# Patient Record
Sex: Female | Born: 1965 | Race: White | Hispanic: No | Smoking: Current every day smoker
Health system: Southern US, Community
[De-identification: ages and names within clinical notes are randomized; demographics above are authoritative.]

## PROBLEM LIST (undated history)

## (undated) DIAGNOSIS — F17200 Nicotine dependence, unspecified, uncomplicated: Secondary | ICD-10-CM

## (undated) DIAGNOSIS — Z8041 Family history of malignant neoplasm of ovary: Secondary | ICD-10-CM

## (undated) DIAGNOSIS — Z803 Family history of malignant neoplasm of breast: Secondary | ICD-10-CM

## (undated) DIAGNOSIS — Z566 Other physical and mental strain related to work: Secondary | ICD-10-CM

## (undated) HISTORY — DX: Family history of malignant neoplasm of ovary: Z80.41

## (undated) HISTORY — DX: Nicotine dependence, unspecified, uncomplicated: F17.200

## (undated) HISTORY — DX: Family history of malignant neoplasm of breast: Z80.3

## (undated) HISTORY — PX: WISDOM TOOTH EXTRACTION: SHX21

## (undated) HISTORY — DX: Other physical and mental strain related to work: Z56.6

---

## 1999-05-11 ENCOUNTER — Ambulatory Visit (HOSPITAL_BASED_OUTPATIENT_CLINIC_OR_DEPARTMENT_OTHER): Admission: RE | Admit: 1999-05-11 | Discharge: 1999-05-11 | Payer: Self-pay | Admitting: Orthopedic Surgery

## 1999-06-20 ENCOUNTER — Ambulatory Visit (HOSPITAL_BASED_OUTPATIENT_CLINIC_OR_DEPARTMENT_OTHER): Admission: RE | Admit: 1999-06-20 | Discharge: 1999-06-20 | Payer: Self-pay | Admitting: Orthopedic Surgery

## 2000-04-02 HISTORY — PX: HAND SURGERY: SHX662

## 2006-03-04 ENCOUNTER — Other Ambulatory Visit: Admission: RE | Admit: 2006-03-04 | Discharge: 2006-03-04 | Payer: Self-pay | Admitting: Gynecology

## 2006-03-20 ENCOUNTER — Encounter: Admission: RE | Admit: 2006-03-20 | Discharge: 2006-03-20 | Payer: Self-pay | Admitting: Gynecology

## 2006-04-01 ENCOUNTER — Encounter: Admission: RE | Admit: 2006-04-01 | Discharge: 2006-04-01 | Payer: Self-pay | Admitting: Gynecology

## 2007-03-06 ENCOUNTER — Other Ambulatory Visit: Admission: RE | Admit: 2007-03-06 | Discharge: 2007-03-06 | Payer: Self-pay | Admitting: Gynecology

## 2007-04-07 ENCOUNTER — Encounter: Admission: RE | Admit: 2007-04-07 | Discharge: 2007-04-07 | Payer: Self-pay | Admitting: Gynecology

## 2008-03-08 ENCOUNTER — Ambulatory Visit: Payer: Self-pay | Admitting: Women's Health

## 2008-03-08 ENCOUNTER — Encounter: Payer: Self-pay | Admitting: Women's Health

## 2008-03-08 ENCOUNTER — Other Ambulatory Visit: Admission: RE | Admit: 2008-03-08 | Discharge: 2008-03-08 | Payer: Self-pay | Admitting: Gynecology

## 2008-04-07 ENCOUNTER — Encounter: Admission: RE | Admit: 2008-04-07 | Discharge: 2008-04-07 | Payer: Self-pay | Admitting: Gynecology

## 2009-03-09 ENCOUNTER — Other Ambulatory Visit: Admission: RE | Admit: 2009-03-09 | Discharge: 2009-03-09 | Payer: Self-pay | Admitting: Gynecology

## 2009-03-09 ENCOUNTER — Ambulatory Visit: Payer: Self-pay | Admitting: Women's Health

## 2009-04-08 ENCOUNTER — Encounter: Admission: RE | Admit: 2009-04-08 | Discharge: 2009-04-08 | Payer: Self-pay | Admitting: Gynecology

## 2010-03-13 ENCOUNTER — Ambulatory Visit: Payer: Self-pay | Admitting: Women's Health

## 2010-03-13 ENCOUNTER — Other Ambulatory Visit
Admission: RE | Admit: 2010-03-13 | Discharge: 2010-03-13 | Payer: Self-pay | Source: Home / Self Care | Admitting: Gynecology

## 2010-04-10 ENCOUNTER — Encounter
Admission: RE | Admit: 2010-04-10 | Discharge: 2010-04-10 | Payer: Self-pay | Source: Home / Self Care | Attending: Gynecology | Admitting: Gynecology

## 2010-04-23 ENCOUNTER — Encounter: Payer: Self-pay | Admitting: Gynecology

## 2011-03-09 DIAGNOSIS — F17200 Nicotine dependence, unspecified, uncomplicated: Secondary | ICD-10-CM | POA: Insufficient documentation

## 2011-03-15 ENCOUNTER — Encounter: Payer: Self-pay | Admitting: Women's Health

## 2011-03-15 ENCOUNTER — Ambulatory Visit (INDEPENDENT_AMBULATORY_CARE_PROVIDER_SITE_OTHER): Payer: Managed Care, Other (non HMO) | Admitting: Women's Health

## 2011-03-15 ENCOUNTER — Other Ambulatory Visit (HOSPITAL_COMMUNITY)
Admission: RE | Admit: 2011-03-15 | Discharge: 2011-03-15 | Disposition: A | Discharge status: Home / Self Care | Payer: Managed Care, Other (non HMO) | Source: Ambulatory Visit | Attending: Women's Health | Admitting: Women's Health

## 2011-03-15 VITALS — BP 118/78 | Ht 62.25 in | Wt 202.0 lb

## 2011-03-15 DIAGNOSIS — Z13220 Encounter for screening for lipoid disorders: Secondary | ICD-10-CM

## 2011-03-15 DIAGNOSIS — F419 Anxiety disorder, unspecified: Secondary | ICD-10-CM

## 2011-03-15 DIAGNOSIS — Z833 Family history of diabetes mellitus: Secondary | ICD-10-CM

## 2011-03-15 DIAGNOSIS — Z01419 Encounter for gynecological examination (general) (routine) without abnormal findings: Secondary | ICD-10-CM

## 2011-03-15 DIAGNOSIS — R82998 Other abnormal findings in urine: Secondary | ICD-10-CM

## 2011-03-15 DIAGNOSIS — Z23 Encounter for immunization: Secondary | ICD-10-CM

## 2011-03-15 DIAGNOSIS — F411 Generalized anxiety disorder: Secondary | ICD-10-CM

## 2011-03-15 MED ORDER — ALPRAZOLAM 0.25 MG PO TABS: 0.2500 mg | ORAL_TABLET | Freq: Three times a day (TID) | ORAL | Status: AC | PRN

## 2011-03-15 NOTE — Progress Notes (Signed)
Kaydynce Pat Aug 30, 1965 [id number]    History:    The patient presents for annual exam.  Monthly 4 day cycle/vasectomy. Situational anxiety, requested refill on Xanax.   Past medical history, past surgical history, family history and social history were all reviewed and documented in the EPIC chart. Mother diagnosed with breast cancer at age 46, BRCA testing not done, living and doing well 46. Smoker half pack per day.   ROS:  A  ROS was performed and pertinent positives and negatives are included in the history.  Exam:  Filed Vitals:   03/15/11 0856  BP: 118/78    General appearance:  Normal Head/Neck:  Normal, without cervical or supraclavicular adenopathy. Thyroid:  Symmetrical, normal in size, without palpable masses or nodularity. Respiratory  Effort:  Normal  Auscultation:  Clear without wheezing or rhonchi Cardiovascular  Auscultation:  Regular rate, without rubs, murmurs or gallops  Edema/varicosities:  Not grossly evident Abdominal  Soft,nontender, without masses, guarding or rebound.  Liver/spleen:  No organomegaly noted  Hernia:  None appreciated  Skin  Inspection:  Grossly normal  Palpation:  Grossly normal Neurologic/psychiatric  Orientation:  Normal with appropriate conversation.  Mood/affect:  Normal  Genitourinary    Breasts: Examined lying and sitting.     Right: Without masses, retractions, discharge or axillary adenopathy.     Left: Without masses, retractions, discharge or axillary adenopathy.   Inguinal/mons:  Normal without inguinal adenopathy  External genitalia:  Normal  BUS/Urethra/Skene's glands:  Normal  Bladder:  Normal  Vagina:  Normal  Cervix:  Normal  Uterus:   normal in size, shape and contour.  Midline and mobile  Adnexa/parametria:     Rt: Without masses or tenderness.   Lt: Without masses or tenderness.  Anus and perineum: Normal  Digital rectal exam: Normal sphincter tone without palpated masses or tenderness  Assessment/Plan:   45 y.o. DW F G2 P2 for annual exam.    Normal GYN exam Smoker half pack per day Situational anxiety  Plan: SBEs, continue annual mammogram, calcium rich diet, vitamin D 2000 daily encouraged. Aware of the hazards of smoking and plans to quit. Denies need for Chantix, discussed smoking cessation. Xanax 0.25 every 8 hours when necessary #30 no refills. Prescription proper use was given aware it is addictive and use sparingly. CBC, glucose, lipid profile, UA and Pap. Flu vaccine given.    Harrington Challenger [facility], 9:46 AM 03/15/2011

## 2011-03-16 LAB — GLUCOSE, RANDOM: Glucose, Bld: 91 mg/dL (ref 70–99)

## 2011-03-23 ENCOUNTER — Other Ambulatory Visit: Payer: Self-pay | Admitting: Gynecology

## 2011-03-23 DIAGNOSIS — Z1231 Encounter for screening mammogram for malignant neoplasm of breast: Secondary | ICD-10-CM

## 2011-04-13 ENCOUNTER — Ambulatory Visit
Admission: RE | Admit: 2011-04-13 | Discharge: 2011-04-13 | Disposition: A | Discharge status: Home / Self Care | Payer: Managed Care, Other (non HMO) | Source: Ambulatory Visit | Attending: Gynecology | Admitting: Gynecology

## 2011-04-13 DIAGNOSIS — Z1231 Encounter for screening mammogram for malignant neoplasm of breast: Secondary | ICD-10-CM

## 2011-04-19 ENCOUNTER — Other Ambulatory Visit: Payer: Self-pay | Admitting: Gynecology

## 2011-04-24 ENCOUNTER — Other Ambulatory Visit: Payer: Self-pay | Admitting: *Deleted

## 2011-04-24 DIAGNOSIS — N63 Unspecified lump in unspecified breast: Secondary | ICD-10-CM

## 2011-05-02 ENCOUNTER — Ambulatory Visit
Admission: RE | Admit: 2011-05-02 | Discharge: 2011-05-02 | Disposition: A | Discharge status: Home / Self Care | Payer: Managed Care, Other (non HMO) | Source: Ambulatory Visit | Attending: Gynecology | Admitting: Gynecology

## 2011-05-02 DIAGNOSIS — N63 Unspecified lump in unspecified breast: Secondary | ICD-10-CM

## 2012-04-07 ENCOUNTER — Ambulatory Visit (INDEPENDENT_AMBULATORY_CARE_PROVIDER_SITE_OTHER): Payer: Managed Care, Other (non HMO) | Admitting: Women's Health

## 2012-04-07 ENCOUNTER — Encounter: Payer: Self-pay | Admitting: Women's Health

## 2012-04-07 VITALS — BP 130/80 | Ht 62.0 in | Wt 200.0 lb

## 2012-04-07 DIAGNOSIS — F419 Anxiety disorder, unspecified: Secondary | ICD-10-CM

## 2012-04-07 DIAGNOSIS — F411 Generalized anxiety disorder: Secondary | ICD-10-CM

## 2012-04-07 DIAGNOSIS — Z13220 Encounter for screening for lipoid disorders: Secondary | ICD-10-CM

## 2012-04-07 DIAGNOSIS — Z01419 Encounter for gynecological examination (general) (routine) without abnormal findings: Secondary | ICD-10-CM

## 2012-04-07 DIAGNOSIS — E079 Disorder of thyroid, unspecified: Secondary | ICD-10-CM

## 2012-04-07 DIAGNOSIS — Z833 Family history of diabetes mellitus: Secondary | ICD-10-CM

## 2012-04-07 LAB — LIPID PANEL
Cholesterol: 215 mg/dL — ABNORMAL HIGH (ref 0–200)
HDL: 47 mg/dL (ref >39)
LDL Cholesterol: 137 mg/dL — ABNORMAL HIGH (ref 0–99)
Total CHOL/HDL Ratio: 4.6 Ratio

## 2012-04-07 LAB — CBC WITH DIFFERENTIAL/PLATELET
Basophils Relative: 0 % (ref 0–1)
Eosinophils Absolute: 0 10*3/uL (ref 0.0–0.7)
Hemoglobin: 14.6 g/dL (ref 12.0–15.0)
MCH: 32.3 pg (ref 26.0–34.0)
Neutro Abs: 6.4 10*3/uL (ref 1.7–7.7)
Platelets: 212 10*3/uL (ref 150–400)
RBC: 4.52 MIL/uL (ref 3.87–5.11)
WBC: 8.4 10*3/uL (ref 4.0–10.5)

## 2012-04-07 LAB — GLUCOSE, RANDOM: Glucose, Bld: 82 mg/dL (ref 70–99)

## 2012-04-07 MED ORDER — ALPRAZOLAM 0.25 MG PO TABS: 0.2500 mg | ORAL_TABLET | Freq: Every evening | ORAL | Status: DC | PRN

## 2012-04-07 NOTE — Patient Instructions (Addendum)

## 2012-04-07 NOTE — Progress Notes (Signed)
Susan Morse 04-24-1965 [id number]    History:    The patient presents for annual exam.  Regular monthly 4-5 day cycles/vasectomy. Smokes less than a half a pack per day. Mother with breast cancer at age 47, BRCA not done, living, 58. Sister with breast cancer with a negative BRCA this year. History of a cyst on mammogram January 2013. Noticed a lump left inner breast 2 weeks ago, has gotten smaller.   Past medical history, past surgical history, family history and social history were all reviewed and documented in the EPIC chart. Works at [employer]. Has 4 grandchildren, helping son who is studying Patent examiner.   ROS:  A  ROS was performed and pertinent positives and negatives are included in the history.  Exam:  Filed Vitals:   04/07/12 0923  BP: 130/80    General appearance:  Normal Head/Neck:  Normal, without cervical or supraclavicular adenopathy. Thyroid:  Symmetrical, normal in size, without palpable masses or nodularity. Respiratory  Effort:  Normal  Auscultation:  Clear without wheezing or rhonchi Cardiovascular  Auscultation:  Regular rate, without rubs, murmurs or gallops  Edema/varicosities:  Not grossly evident Abdominal  Soft,nontender, without masses, guarding or rebound.  Liver/spleen:  No organomegaly noted  Hernia:  None appreciated  Skin  Inspection:  Grossly normal  Palpation:  Grossly normal Neurologic/psychiatric  Orientation:  Normal with appropriate conversation.  Mood/affect:  Normal  Genitourinary    Breasts: Examined lying and sitting.     Right: Without masses, retractions, discharge or axillary adenopathy.     Left: Probable superficial 2 cm sebaceous cyst inner aspect at sternum    Inguinal/mons:  Normal without inguinal adenopathy  External genitalia:  Normal  BUS/Urethra/Skene's glands:  Normal  Bladder:  Normal  Vagina:  Normal  Cervix:  Normal  Uterus:  normal in size, shape and contour.  Midline and mobile  Adnexa/parametria:      Rt: Without masses or tenderness.   Lt: Without masses or tenderness.  Anus and perineum: Normal  Digital rectal exam: Normal sphincter tone without palpated masses or tenderness  Assessment/Plan:  47 y.o. WF G2 P2 for annual exam with complaint of  lump left breast inner aspect for 2 weeks.     Left breast nodule/probable sebaceous cyst/Sister and mother with breast cancer Smoker-less than half pack per day Obesity  Plan: Diagnostic left breast mammogram and annual mammogram which is due. SBE's, continue annual mammogram, increase regular exercise, decrease calories for weight loss, calcium rich diet, vitamin D 1000 daily encouraged. Continue to cut back on smoking, using electronic cigarette also. CBC, glucose, lipid panel, TSH, UA, Pap normal 2012, new screening guidelines reviewed.    Harrington Challenger [facility], 10:00 AM 04/07/2012

## 2012-04-08 ENCOUNTER — Other Ambulatory Visit: Payer: Self-pay | Admitting: Women's Health

## 2012-04-08 ENCOUNTER — Telehealth: Payer: Self-pay | Admitting: *Deleted

## 2012-04-08 DIAGNOSIS — N6009 Solitary cyst of unspecified breast: Secondary | ICD-10-CM

## 2012-04-08 LAB — URINALYSIS W MICROSCOPIC + REFLEX CULTURE
Hgb urine dipstick: NEGATIVE
Leukocytes, UA: NEGATIVE
Nitrite: NEGATIVE
Protein, ur: NEGATIVE mg/dL

## 2012-04-08 LAB — TSH: TSH: 3.154 u[IU]/mL (ref 0.350–4.500)

## 2012-04-08 NOTE — Telephone Encounter (Signed)
Message copied by Aura Camps on Tue Apr 08, 2012  8:41 AM ------      Message from: [place], [place] J      Created: Mon Apr 07, 2012 10:09 AM       Please schedule diagnostic mammogram at breast center. 2 cm probable sebaceous cyst on left inner breast at sternum. Mother and sister with breast cancer history. Patient can go at any time. Last mammogram January 2013, had a right breast cyst.

## 2012-04-11 NOTE — Telephone Encounter (Signed)
appt. 04/14/12 @ 2:40pm.

## 2012-04-14 ENCOUNTER — Ambulatory Visit
Admission: RE | Admit: 2012-04-14 | Discharge: 2012-04-14 | Disposition: A | Discharge status: Home / Self Care | Payer: Managed Care, Other (non HMO) | Source: Ambulatory Visit | Attending: Women's Health | Admitting: Women's Health

## 2012-04-14 DIAGNOSIS — N6009 Solitary cyst of unspecified breast: Secondary | ICD-10-CM

## 2012-08-20 ENCOUNTER — Ambulatory Visit (INDEPENDENT_AMBULATORY_CARE_PROVIDER_SITE_OTHER): Payer: Managed Care, Other (non HMO) | Admitting: Physician Assistant

## 2012-08-20 ENCOUNTER — Ambulatory Visit: Payer: Self-pay | Admitting: Women's Health

## 2012-08-20 VITALS — BP 110/70 | HR 82 | Temp 99.2°F | Resp 18 | Ht 63.0 in | Wt 205.0 lb

## 2012-08-20 DIAGNOSIS — M25519 Pain in unspecified shoulder: Secondary | ICD-10-CM

## 2012-08-20 DIAGNOSIS — M25512 Pain in left shoulder: Secondary | ICD-10-CM

## 2012-08-20 MED ORDER — MELOXICAM 15 MG PO TABS: 15.0000 mg | ORAL_TABLET | Freq: Every day | ORAL | Status: DC

## 2012-08-20 NOTE — Progress Notes (Signed)
  Subjective:    Patient ID: Susan Morse, female    DOB: 1965/12/19, 47 y.o.   MRN: [id number]  HPI    Susan Morse is a very pleasant 47 yr old female here with concern for left shoulder pain.  States she was chopping wood with her brother 5 days ago and moving the logs across the lawn.  The next day her shoulder felt a little stiff, but then yesterday after playing with her grandkids she began having pain.  Does not recall specific injury that incited pain, more insidious onset.  States it "hurts inside", doesn't really hurt to the touch.  Certain movements make the pain worse, though she can't articulate which movements are the most painful.  Did have some trouble sleeping last night but not sure if this is from the shoulder or from sinus trouble.  She has injured this shoulder in the distant past but has no ongoing issues.  Right shoulder feels a little stiff, but not painful like the left.  Took 2 Aleve at 10am today, no ice or heat.     Review of Systems  Constitutional: Negative for fever and chills.  HENT: Negative.   Respiratory: Negative.   Cardiovascular: Negative.   Gastrointestinal: Negative.   Musculoskeletal: Positive for myalgias and arthralgias.  Skin: Negative.   Neurological: Negative.        Objective:   Physical Exam  Vitals reviewed. Constitutional: She is oriented to person, place, and time. She appears well-developed and well-nourished. No distress.  HENT:  Head: Normocephalic and atraumatic.  Eyes: Conjunctivae are normal. No scleral icterus.  Pulmonary/Chest: Effort normal.  Musculoskeletal:       Right shoulder: Normal.       Left shoulder: She exhibits tenderness and pain. She exhibits normal range of motion, no bony tenderness, no swelling, no effusion, no crepitus, no deformity, no spasm and normal strength.       Arms: TTP over supraspinatus tendon; mild TTP over biceps tendon; full AROM; 5/5 strength; sensation intact; negative Neer's; negative drop arm    Neurological: She is alert and oriented to person, place, and time.  Skin: Skin is warm and dry.  Psychiatric: She has a normal mood and affect. Her behavior is normal.          Assessment & Plan:  Pain in joint, shoulder region, left - Plan: meloxicam (MOBIC) 15 MG tablet   Susan Morse is a pleasant 47 yr old female here with left shoulder pain.  Suspect overuse injury after chopping wood 5 days ago, exacerbated by playing with grandchildren last night.  Will start Mobic q24h tonight (as pt took Aleve approx 3 hours ago).  Encouraged icing 3-4 times per day.  Relative rest, but I do not want her to keep the shoulder completely immobilized.  Tylenol if needed for breakthrough pain.  Expect gradual improvement over the next week.  If worsening or no improvement in 7 days, pt to call, may need further eval.  Would consider PT or ortho eval if ongoing symptoms.

## 2012-08-20 NOTE — Patient Instructions (Addendum)
Begin taking the Mobic (meloxicam) tonight around 10pm - take one every 24 hours to help with pain/inflammation.  Continue this for at least 7 days.  Begin icing the shoulder today - 3-4 times per day for 15-20 minutes at a time.  Do not take any additional ibuprofen or aleve while taking the mobic - if you need further pain relief, you may take Tylenol (up to 1000mg  every 8 hours).  Do not keep the shoulder completely immobilized, gently moving the arm will be beneficial.  If you are worsening or not seeing any improvement in one week, please let us know.  We can consider physical therapy or a referral ortho.   Shoulder Pain The shoulder is the joint that connects your arms to your body. The bones that form the shoulder joint include the upper arm bone (humerus), the shoulder blade (scapula), and the collarbone (clavicle). The top of the humerus is shaped like a ball and fits into a rather flat socket on the scapula (glenoid cavity). A combination of muscles and strong, fibrous tissues that connect muscles to bones (tendons) support your shoulder joint and hold the ball in the socket. Small, fluid-filled sacs (bursae) are located in different areas of the joint. They act as cushions between the bones and the overlying soft tissues and help reduce friction between the gliding tendons and the bone as you move your arm. Your shoulder joint allows a wide range of motion in your arm. This range of motion allows you to do things like scratch your back or throw a ball. However, this range of motion also makes your shoulder more prone to pain from overuse and injury. Causes of shoulder pain can originate from both injury and overuse and usually can be grouped in the following four categories:  Redness, swelling, and pain (inflammation) of the tendon (tendinitis) or the bursae (bursitis).  Instability, such as a dislocation of the joint.  Inflammation of the joint (arthritis).  Broken bone (fracture). HOME  CARE INSTRUCTIONS   Apply ice to the sore area.  Put ice in a plastic bag.  Place a towel between your skin and the bag.  Leave the ice on for 15-20 minutes, 3-4 times per day for the first 2 days.  Stop using cold packs if they do not help with the pain.  Squeeze a soft ball or foam pad as much as possible to help prevent swelling.  Only take over-the-counter or prescription medicines for pain, discomfort, or fever as directed by your caregiver. SEEK MEDICAL CARE IF:   Your shoulder pain increases, or new pain develops in your arm, hand, or fingers.  Your hand or fingers become cold and numb.  Your pain is not relieved with medicines. SEEK IMMEDIATE MEDICAL CARE IF:   Your arm, hand, or fingers are numb or tingling.  Your arm, hand, or fingers are significantly swollen or turn white or blue. MAKE SURE YOU:   Understand these instructions.  Will watch your condition.  Will get help right away if you are not doing well or get worse. Document Released: 12/27/2004 Document Revised: 12/12/2011 Document Reviewed: 03/03/2011 [facility] Patient Information 2014 [place], [place].

## 2013-04-06 ENCOUNTER — Other Ambulatory Visit: Payer: Self-pay

## 2013-04-06 DIAGNOSIS — Z1231 Encounter for screening mammogram for malignant neoplasm of breast: Secondary | ICD-10-CM

## 2013-04-09 ENCOUNTER — Ambulatory Visit (INDEPENDENT_AMBULATORY_CARE_PROVIDER_SITE_OTHER): Payer: BC Managed Care – PPO | Admitting: Women's Health

## 2013-04-09 ENCOUNTER — Encounter: Payer: Managed Care, Other (non HMO) | Admitting: Women's Health

## 2013-04-09 ENCOUNTER — Other Ambulatory Visit (HOSPITAL_COMMUNITY)
Admission: RE | Admit: 2013-04-09 | Discharge: 2013-04-09 | Disposition: A | Discharge status: Home / Self Care | Payer: BC Managed Care – PPO | Source: Ambulatory Visit | Attending: Gynecology | Admitting: Gynecology

## 2013-04-09 ENCOUNTER — Encounter: Payer: Self-pay | Admitting: Women's Health

## 2013-04-09 VITALS — BP 124/74 | Ht 62.25 in | Wt 206.8 lb

## 2013-04-09 DIAGNOSIS — Z23 Encounter for immunization: Secondary | ICD-10-CM

## 2013-04-09 DIAGNOSIS — Z13220 Encounter for screening for lipoid disorders: Secondary | ICD-10-CM

## 2013-04-09 DIAGNOSIS — E079 Disorder of thyroid, unspecified: Secondary | ICD-10-CM

## 2013-04-09 DIAGNOSIS — Z833 Family history of diabetes mellitus: Secondary | ICD-10-CM

## 2013-04-09 DIAGNOSIS — F411 Generalized anxiety disorder: Secondary | ICD-10-CM

## 2013-04-09 DIAGNOSIS — F419 Anxiety disorder, unspecified: Secondary | ICD-10-CM

## 2013-04-09 DIAGNOSIS — Z01419 Encounter for gynecological examination (general) (routine) without abnormal findings: Secondary | ICD-10-CM | POA: Insufficient documentation

## 2013-04-09 LAB — CBC WITH DIFFERENTIAL/PLATELET
Basophils Absolute: 0 10*3/uL (ref 0.0–0.1)
Basophils Relative: 0 % (ref 0–1)
EOS ABS: 0.1 10*3/uL (ref 0.0–0.7)
Eosinophils Relative: 1 % (ref 0–5)
HEMATOCRIT: 43.9 % (ref 36.0–46.0)
HEMOGLOBIN: 15.2 g/dL — AB (ref 12.0–15.0)
LYMPHS ABS: 1.8 10*3/uL (ref 0.7–4.0)
Lymphocytes Relative: 22 % (ref 12–46)
MCH: 33.9 pg (ref 26.0–34.0)
MCHC: 34.6 g/dL (ref 30.0–36.0)
MCV: 98 fL (ref 78.0–100.0)
MONOS PCT: 7 % (ref 3–12)
Monocytes Absolute: 0.5 10*3/uL (ref 0.1–1.0)
NEUTROS ABS: 5.6 10*3/uL (ref 1.7–7.7)
NEUTROS PCT: 70 % (ref 43–77)
Platelets: 203 10*3/uL (ref 150–400)
RBC: 4.48 MIL/uL (ref 3.87–5.11)
RDW: 13.5 % (ref 11.5–15.5)
WBC: 8 10*3/uL (ref 4.0–10.5)

## 2013-04-09 LAB — LIPID PANEL
CHOL/HDL RATIO: 3.9 ratio
CHOLESTEROL: 204 mg/dL — AB (ref 0–200)
HDL: 52 mg/dL (ref >39)
LDL Cholesterol: 131 mg/dL — ABNORMAL HIGH (ref 0–99)
Triglycerides: 107 mg/dL (ref <150)
VLDL: 21 mg/dL (ref 0–40)

## 2013-04-09 LAB — GLUCOSE, RANDOM: GLUCOSE: 83 mg/dL (ref 70–99)

## 2013-04-09 MED ORDER — ALPRAZOLAM 0.25 MG PO TABS: 0.2500 mg | ORAL_TABLET | Freq: Every evening | ORAL | Status: DC | PRN

## 2013-04-09 NOTE — Progress Notes (Signed)
Susan Morse [birth year] [id number]    History:    The patient presents for annual exam.  Monthly cycle/ Same partner/ Vasectomy. Normal Pap and mammogram history. Under increased stress at work, requesting refill on Xanax. Smoker half pack per day   Past medical history, past surgical history, family history and social history were all reviewed and documented in the EPIC chart. Mother breast cancer age 48, sister breast cancer BRCA status unknown. 2 children 4 grandchildren all well.    ROS:  A  ROS was performed and pertinent positives and negatives are included in the history.  Exam:  Filed Vitals:   04/09/13 0955  BP: 124/74    General appearance:  Normal, obese Head/Neck:  Normal, without cervical or supraclavicular adenopathy. Thyroid:  Symmetrical, normal in size, without palpable masses or nodularity. Respiratory  Effort:  Normal  Auscultation:  Clear without wheezing or rhonchi Cardiovascular  Auscultation:  Regular rate, without rubs, murmurs or gallops  Edema/varicosities:  Not grossly evident Abdominal  Soft,nontender, without masses, guarding or rebound.  Liver/spleen:  No organomegaly noted  Hernia:  None appreciated  Skin  Inspection:  Grossly normal  Palpation:  Grossly normal Neurologic/psychiatric  Orientation:  Normal with appropriate conversation.  Mood/affect:  Normal  Genitourinary    Breasts: Pendulous. Examined lying and sitting.     Right: Without masses, retractions, discharge or axillary adenopathy.     Left: Without masses, retractions, discharge or axillary adenopathy.   Inguinal/mons:  Normal without inguinal adenopathy  External genitalia:  Normal  BUS/Urethra/Skene's glands:  Normal  Bladder:  Normal  Vagina:  Normal  Cervix:  Normal  Uterus:  Normal in size, shape and contour.  Midline and mobile  Adnexa/parametria:     Rt: Without masses or tenderness.   Lt: Without masses or tenderness.  Anus and perineum: Normal  Digital rectal  exam: Normal sphincter tone without palpated masses or tenderness  Assessment:  48 y.o. SWF G2P2  for annual exam.   Normal GYN exam/vasectomy Obesity Anxiety  Plan: SBE's. Continue annual mammogram, 3D tomography reviewed and encouraged, family history of breast cancer.   Discussed exercise, fitness programs and calorie control for weight loss.SBE's. Continue annual mammogram.  Encouraged smoking cessation and weight loss. CBC, glucose lipid profile, UA. Tdap. Pap. Pap normal 03/2011, new screening guidelines reviewed.  Xanax 0.25 every 8 hours when necessary aware of addictive properties and does use sparingly.     Huel Cote [facility], 10:37 AM 04/09/2013

## 2013-04-09 NOTE — Patient Instructions (Signed)

## 2013-04-09 NOTE — Addendum Note (Signed)
Addended by: Alen Blew on: 04/09/2013 12:18 PM   Modules accepted: Orders

## 2013-04-10 LAB — URINALYSIS W MICROSCOPIC + REFLEX CULTURE
BACTERIA UA: NONE SEEN
BILIRUBIN URINE: NEGATIVE
Casts: NONE SEEN
Crystals: NONE SEEN
GLUCOSE, UA: NEGATIVE mg/dL
HGB URINE DIPSTICK: NEGATIVE
Ketones, ur: NEGATIVE mg/dL
Leukocytes, UA: NEGATIVE
Nitrite: NEGATIVE
PROTEIN: NEGATIVE mg/dL
Specific Gravity, Urine: 1.007 (ref 1.005–1.030)
Squamous Epithelial / LPF: NONE SEEN
Urobilinogen, UA: 0.2 mg/dL (ref 0.0–1.0)
pH: 6.5 (ref 5.0–8.0)

## 2013-04-10 LAB — TSH: TSH: 3.048 u[IU]/mL (ref 0.350–4.500)

## 2013-04-10 NOTE — Addendum Note (Signed)
Addended by: Alen Blew on: 04/10/2013 10:25 AM   Modules accepted: Orders

## 2013-04-24 ENCOUNTER — Ambulatory Visit
Admission: RE | Admit: 2013-04-24 | Discharge: 2013-04-24 | Disposition: A | Discharge status: Home / Self Care | Payer: BC Managed Care – PPO | Source: Ambulatory Visit

## 2013-04-24 DIAGNOSIS — Z1231 Encounter for screening mammogram for malignant neoplasm of breast: Secondary | ICD-10-CM

## 2013-05-01 ENCOUNTER — Other Ambulatory Visit: Payer: Self-pay | Admitting: Women's Health

## 2013-05-01 DIAGNOSIS — R928 Other abnormal and inconclusive findings on diagnostic imaging of breast: Secondary | ICD-10-CM

## 2013-05-12 ENCOUNTER — Ambulatory Visit
Admission: RE | Admit: 2013-05-12 | Discharge: 2013-05-12 | Disposition: A | Discharge status: Home / Self Care | Payer: Self-pay | Source: Ambulatory Visit | Attending: Women's Health | Admitting: Women's Health

## 2013-05-12 DIAGNOSIS — R928 Other abnormal and inconclusive findings on diagnostic imaging of breast: Secondary | ICD-10-CM

## 2013-05-14 ENCOUNTER — Telehealth: Payer: Self-pay | Admitting: *Deleted

## 2013-05-14 DIAGNOSIS — Z803 Family history of malignant neoplasm of breast: Secondary | ICD-10-CM

## 2013-05-14 NOTE — Telephone Encounter (Signed)
Order placed for MRI [facility] imaging will contact pt to schedule.

## 2013-05-14 NOTE — Telephone Encounter (Signed)
Message copied by Thamas Jaegers on Thu May 14, 2013  2:58 PM ------      Message from: [place], [place] J      Created: Tue May 12, 2013  9:30 AM       Telephone call, reviewed mammogram recommendations, repeat diagnostic left- mammogram in 6 months and breast MRI was recommended for now. We'll get scheduled. Reviewed if any changes in 6 months diagnostic mammogram would recommend biopsy at that time. States feels comfortable with plan. Mother with breast cancer age 19 and sister at age 43.            Anderson Malta  Please schedule breast MRI for any day other than Thursday and call at [phone].  thanks ------

## 2013-05-20 ENCOUNTER — Telehealth: Payer: Self-pay

## 2013-05-20 NOTE — Telephone Encounter (Signed)
I contacted [employer] for prior auth for bilateral MRI of breasts due to  breast density and strong family history.  The clinical nurse reviewer requested lifetime risk. She had me google for a lifetime risk calculator and I called the patient and calculated her lifetime risk at 39.5%. I called back and spoke with nurse, Angela Nevin who authorized the MRI auth #[id number] valid x 30 days from today.  Info was added to her appt notes.

## 2013-05-20 NOTE — Telephone Encounter (Signed)
Appointment 05/22/13 at [facility]

## 2013-05-22 ENCOUNTER — Ambulatory Visit
Admission: RE | Admit: 2013-05-22 | Discharge: 2013-05-22 | Disposition: A | Discharge status: Home / Self Care | Payer: BC Managed Care – PPO | Source: Ambulatory Visit | Attending: Women's Health | Admitting: Women's Health

## 2013-05-22 DIAGNOSIS — Z803 Family history of malignant neoplasm of breast: Secondary | ICD-10-CM

## 2013-05-22 MED ORDER — GADOBENATE DIMEGLUMINE 529 MG/ML IV SOLN
20.0000 mL | Freq: Once | INTRAVENOUS | Status: AC | PRN
  Administered 2013-05-22: 20 mL via INTRAVENOUS

## 2013-11-09 ENCOUNTER — Other Ambulatory Visit: Payer: Self-pay | Admitting: Women's Health

## 2013-11-09 DIAGNOSIS — R921 Mammographic calcification found on diagnostic imaging of breast: Secondary | ICD-10-CM

## 2013-11-09 DIAGNOSIS — N6320 Unspecified lump in the left breast, unspecified quadrant: Secondary | ICD-10-CM

## 2013-11-11 ENCOUNTER — Other Ambulatory Visit: Payer: Self-pay | Admitting: Women's Health

## 2013-11-11 ENCOUNTER — Other Ambulatory Visit: Payer: Self-pay

## 2013-11-11 DIAGNOSIS — R921 Mammographic calcification found on diagnostic imaging of breast: Secondary | ICD-10-CM

## 2013-11-11 DIAGNOSIS — N6320 Unspecified lump in the left breast, unspecified quadrant: Secondary | ICD-10-CM

## 2013-11-17 ENCOUNTER — Ambulatory Visit
Admission: RE | Admit: 2013-11-17 | Discharge: 2013-11-17 | Disposition: A | Discharge status: Home / Self Care | Payer: BC Managed Care – PPO | Source: Ambulatory Visit | Attending: Women's Health | Admitting: Women's Health

## 2013-11-17 ENCOUNTER — Encounter (INDEPENDENT_AMBULATORY_CARE_PROVIDER_SITE_OTHER): Payer: Self-pay

## 2013-11-17 DIAGNOSIS — R921 Mammographic calcification found on diagnostic imaging of breast: Secondary | ICD-10-CM

## 2013-11-17 DIAGNOSIS — N6320 Unspecified lump in the left breast, unspecified quadrant: Secondary | ICD-10-CM

## 2014-02-01 ENCOUNTER — Encounter: Payer: Self-pay | Admitting: Women's Health

## 2014-04-13 ENCOUNTER — Encounter: Payer: BC Managed Care – PPO | Admitting: Women's Health

## 2014-04-23 ENCOUNTER — Encounter: Payer: Self-pay | Admitting: Women's Health

## 2014-05-04 ENCOUNTER — Ambulatory Visit (INDEPENDENT_AMBULATORY_CARE_PROVIDER_SITE_OTHER): Payer: BLUE CROSS/BLUE SHIELD | Admitting: Women's Health

## 2014-05-04 ENCOUNTER — Encounter: Payer: Self-pay | Admitting: Women's Health

## 2014-05-04 VITALS — BP 124/80 | Ht 62.0 in | Wt 195.0 lb

## 2014-05-04 DIAGNOSIS — Z01419 Encounter for gynecological examination (general) (routine) without abnormal findings: Secondary | ICD-10-CM

## 2014-05-04 DIAGNOSIS — F419 Anxiety disorder, unspecified: Secondary | ICD-10-CM

## 2014-05-04 DIAGNOSIS — Z13220 Encounter for screening for lipoid disorders: Secondary | ICD-10-CM

## 2014-05-04 LAB — COMPREHENSIVE METABOLIC PANEL
ALT: 25 U/L (ref 0–35)
AST: 20 U/L (ref 0–37)
Albumin: 4 g/dL (ref 3.5–5.2)
Alkaline Phosphatase: 48 U/L (ref 39–117)
BILIRUBIN TOTAL: 0.6 mg/dL (ref 0.2–1.2)
BUN: 14 mg/dL (ref 6–23)
CALCIUM: 8.7 mg/dL (ref 8.4–10.5)
CO2: 24 mEq/L (ref 19–32)
Chloride: 107 mEq/L (ref 96–112)
Creat: 1.07 mg/dL (ref 0.50–1.10)
GLUCOSE: 89 mg/dL (ref 70–99)
Potassium: 4.1 mEq/L (ref 3.5–5.3)
Sodium: 137 mEq/L (ref 135–145)
Total Protein: 6.7 g/dL (ref 6.0–8.3)

## 2014-05-04 LAB — CBC WITH DIFFERENTIAL/PLATELET
BASOS ABS: 0 10*3/uL (ref 0.0–0.1)
Basophils Relative: 0 % (ref 0–1)
EOS PCT: 1 % (ref 0–5)
Eosinophils Absolute: 0.1 10*3/uL (ref 0.0–0.7)
HCT: 43.3 % (ref 36.0–46.0)
HEMOGLOBIN: 14.7 g/dL (ref 12.0–15.0)
Lymphocytes Relative: 18 % (ref 12–46)
Lymphs Abs: 1.4 10*3/uL (ref 0.7–4.0)
MCH: 32.5 pg (ref 26.0–34.0)
MCHC: 33.9 g/dL (ref 30.0–36.0)
MCV: 95.6 fL (ref 78.0–100.0)
MPV: 11.2 fL (ref 8.6–12.4)
Monocytes Absolute: 0.5 10*3/uL (ref 0.1–1.0)
Monocytes Relative: 6 % (ref 3–12)
NEUTROS PCT: 75 % (ref 43–77)
Neutro Abs: 5.9 10*3/uL (ref 1.7–7.7)
Platelets: 204 10*3/uL (ref 150–400)
RBC: 4.53 MIL/uL (ref 3.87–5.11)
RDW: 12.8 % (ref 11.5–15.5)
WBC: 7.8 10*3/uL (ref 4.0–10.5)

## 2014-05-04 LAB — LIPID PANEL
CHOL/HDL RATIO: 4 ratio
Cholesterol: 198 mg/dL (ref 0–200)
HDL: 50 mg/dL (ref >39)
LDL Cholesterol: 122 mg/dL — ABNORMAL HIGH (ref 0–99)
Triglycerides: 130 mg/dL (ref <150)
VLDL: 26 mg/dL (ref 0–40)

## 2014-05-04 MED ORDER — ALPRAZOLAM 0.25 MG PO TABS: 2.0000 mg | ORAL_TABLET | Freq: Every evening | ORAL | Status: DC | PRN

## 2014-05-04 NOTE — Patient Instructions (Signed)

## 2014-05-04 NOTE — Progress Notes (Signed)
Susan Morse 10/08/1965 [id number]    History:    Presents for annual exam.  Monthly cycle/same partner/vasectomy. Normal Pap history. Normal mammograms after diagnostic, had normal left breast MRI and is due for bilateral diagnostic mammogram will schedule. Mother, sister, breast cancer BRCA status unknown, declines. Smoker half pack daily. Increased stress son, wife and 2 grandchildren moved back in. Reports work life good.  Past medical history, past surgical history, family history and social history were all reviewed and documented in the EPIC chart. Works at [employer]. Father hypertension. 2 sons.   ROS:  A ROS was performed and pertinent positives and negatives are included.  Exam:  Filed Vitals:   05/04/14 0759  BP: 124/80    General appearance:  Normal Thyroid:  Symmetrical, normal in size, without palpable masses or nodularity. Respiratory  Auscultation:  Clear without wheezing or rhonchi Cardiovascular  Auscultation:  Regular rate, without rubs, murmurs or gallops  Edema/varicosities:  Not grossly evident Abdominal  Soft,nontender, without masses, guarding or rebound.  Liver/spleen:  No organomegaly noted  Hernia:  None appreciated  Skin  Inspection:  Grossly normal   Breasts: Examined lying and sitting.     Right: Without masses, retractions, discharge or axillary adenopathy.     Left: Without masses, retractions, discharge or axillary adenopathy. Gentitourinary   Inguinal/mons:  Normal without inguinal adenopathy  External genitalia:  Normal  BUS/Urethra/Skene's glands:  Normal  Vagina:  Normal  Cervix:  Normal  Uterus:   normal in size, shape and contour.  Midline and mobile  Adnexa/parametria:     Rt: Without masses or tenderness.   Lt: Without masses or tenderness.  Anus and perineum: Normal  Digital rectal exam: Normal sphincter tone without palpated masses or tenderness  Assessment/Plan:  49 y.o. DWF G2P2 for annual exam.     Situational stress Monthly  cycle/vasectomy Smoker Mother, sister - survivors  of breast cancer  Plan: SBE's, schedule diagnostic mammogram. BRCA testing reviewed and declined. 3-D tomography reviewed and encouraged when back to annual screening. Reviewed importance of no smoking for health, NicoDerm patches, Chantix reviewed, declines. Increase regular exercise, calcium rich diet, vitamin D 2000 daily encouraged. Xanax 0.25 at bedtime when necessary prescription, proper use, addictive properties reviewed encouraged to use sparingly. CBC, TSH, lipid panel, CMP, UA, Pap. New screening guidelines reviewed.    Huel Cote [facility], 12:45 PM 05/04/2014

## 2014-05-05 LAB — URINALYSIS W MICROSCOPIC + REFLEX CULTURE
BACTERIA UA: NONE SEEN
Bilirubin Urine: NEGATIVE
CASTS: NONE SEEN
CRYSTALS: NONE SEEN
GLUCOSE, UA: NEGATIVE mg/dL
HGB URINE DIPSTICK: NEGATIVE
KETONES UR: NEGATIVE mg/dL
Nitrite: NEGATIVE
PH: 5.5 (ref 5.0–8.0)
Protein, ur: NEGATIVE mg/dL
SPECIFIC GRAVITY, URINE: 1.009 (ref 1.005–1.030)
SQUAMOUS EPITHELIAL / LPF: NONE SEEN
Urobilinogen, UA: 0.2 mg/dL (ref 0.0–1.0)

## 2014-05-05 LAB — TSH: TSH: 2.376 u[IU]/mL (ref 0.350–4.500)

## 2014-05-06 LAB — URINE CULTURE: Colony Count: 2000

## 2014-05-10 ENCOUNTER — Other Ambulatory Visit: Payer: Self-pay | Admitting: Women's Health

## 2014-05-10 DIAGNOSIS — R921 Mammographic calcification found on diagnostic imaging of breast: Secondary | ICD-10-CM

## 2014-05-18 ENCOUNTER — Ambulatory Visit
Admission: RE | Admit: 2014-05-18 | Discharge: 2014-05-18 | Disposition: A | Discharge status: Home / Self Care | Payer: BLUE CROSS/BLUE SHIELD | Source: Ambulatory Visit | Attending: Women's Health | Admitting: Women's Health

## 2014-05-18 DIAGNOSIS — R921 Mammographic calcification found on diagnostic imaging of breast: Secondary | ICD-10-CM

## 2015-05-23 ENCOUNTER — Other Ambulatory Visit: Payer: Self-pay | Admitting: Women's Health

## 2015-05-23 DIAGNOSIS — R921 Mammographic calcification found on diagnostic imaging of breast: Secondary | ICD-10-CM

## 2015-06-01 ENCOUNTER — Ambulatory Visit
Admission: RE | Admit: 2015-06-01 | Discharge: 2015-06-01 | Disposition: A | Discharge status: Home / Self Care | Payer: BLUE CROSS/BLUE SHIELD | Source: Ambulatory Visit | Attending: Women's Health | Admitting: Women's Health

## 2015-06-01 DIAGNOSIS — R921 Mammographic calcification found on diagnostic imaging of breast: Secondary | ICD-10-CM

## 2015-06-16 ENCOUNTER — Ambulatory Visit (INDEPENDENT_AMBULATORY_CARE_PROVIDER_SITE_OTHER): Payer: BLUE CROSS/BLUE SHIELD | Admitting: Women's Health

## 2015-06-16 ENCOUNTER — Encounter: Payer: Self-pay | Admitting: Women's Health

## 2015-06-16 VITALS — BP 122/80 | Ht 62.5 in | Wt 201.0 lb

## 2015-06-16 DIAGNOSIS — E669 Obesity, unspecified: Secondary | ICD-10-CM | POA: Insufficient documentation

## 2015-06-16 DIAGNOSIS — Z13220 Encounter for screening for lipoid disorders: Secondary | ICD-10-CM | POA: Diagnosis not present

## 2015-06-16 DIAGNOSIS — Z23 Encounter for immunization: Secondary | ICD-10-CM | POA: Diagnosis not present

## 2015-06-16 DIAGNOSIS — Z01419 Encounter for gynecological examination (general) (routine) without abnormal findings: Secondary | ICD-10-CM

## 2015-06-16 DIAGNOSIS — F419 Anxiety disorder, unspecified: Secondary | ICD-10-CM | POA: Diagnosis not present

## 2015-06-16 LAB — COMPREHENSIVE METABOLIC PANEL
ALBUMIN: 4.1 g/dL (ref 3.6–5.1)
ALK PHOS: 49 U/L (ref 33–130)
ALT: 24 U/L (ref 6–29)
AST: 16 U/L (ref 10–35)
BUN: 9 mg/dL (ref 7–25)
CO2: 22 mmol/L (ref 20–31)
CREATININE: 0.9 mg/dL (ref 0.50–1.05)
Calcium: 8.9 mg/dL (ref 8.6–10.4)
Chloride: 102 mmol/L (ref 98–110)
Glucose, Bld: 89 mg/dL (ref 65–99)
Potassium: 4.1 mmol/L (ref 3.5–5.3)
SODIUM: 137 mmol/L (ref 135–146)
Total Bilirubin: 0.6 mg/dL (ref 0.2–1.2)
Total Protein: 6.6 g/dL (ref 6.1–8.1)

## 2015-06-16 LAB — CBC WITH DIFFERENTIAL/PLATELET
BASOS ABS: 0 10*3/uL (ref 0.0–0.1)
Basophils Relative: 0 % (ref 0–1)
EOS PCT: 1 % (ref 0–5)
Eosinophils Absolute: 0.1 10*3/uL (ref 0.0–0.7)
HCT: 45.3 % (ref 36.0–46.0)
Hemoglobin: 15.4 g/dL — ABNORMAL HIGH (ref 12.0–15.0)
LYMPHS PCT: 18 % (ref 12–46)
Lymphs Abs: 1.2 10*3/uL (ref 0.7–4.0)
MCH: 32.8 pg (ref 26.0–34.0)
MCHC: 34 g/dL (ref 30.0–36.0)
MCV: 96.4 fL (ref 78.0–100.0)
MPV: 11.1 fL (ref 8.6–12.4)
Monocytes Absolute: 0.5 10*3/uL (ref 0.1–1.0)
Monocytes Relative: 7 % (ref 3–12)
NEUTROS PCT: 74 % (ref 43–77)
Neutro Abs: 5.1 10*3/uL (ref 1.7–7.7)
Platelets: 195 10*3/uL (ref 150–400)
RBC: 4.7 MIL/uL (ref 3.87–5.11)
RDW: 13 % (ref 11.5–15.5)
WBC: 6.9 10*3/uL (ref 4.0–10.5)

## 2015-06-16 LAB — LIPID PANEL
CHOLESTEROL: 207 mg/dL — AB (ref 125–200)
HDL: 53 mg/dL (ref >=46)
LDL CALC: 133 mg/dL — AB (ref <130)
TRIGLYCERIDES: 106 mg/dL (ref <150)
Total CHOL/HDL Ratio: 3.9 Ratio (ref <=5.0)
VLDL: 21 mg/dL (ref <30)

## 2015-06-16 LAB — TSH: TSH: 2.81 mIU/L

## 2015-06-16 MED ORDER — ALPRAZOLAM 0.25 MG PO TABS: 2.0000 mg | ORAL_TABLET | Freq: Every evening | ORAL | Status: DC | PRN

## 2015-06-16 MED FILL — ALPRAZolam 0.25 MG TABS: 0.25 | 4 days supply | Qty: 30 | Fill #0
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
## 2015-06-16 NOTE — Patient Instructions (Signed)
Colonoscopy  Dr Carlean Purl or Dr [facility]  [phone]   Health Maintenance, Female Adopting a healthy lifestyle and getting preventive care can go a long way to promote health and wellness. Talk with your health care provider about what schedule of regular examinations is right for you. This is a good chance for you to check in with your provider about disease prevention and staying healthy. In between checkups, there are plenty of things you can do on your own. Experts have done a lot of research about which lifestyle changes and preventive measures are most likely to keep you healthy. Ask your health care provider for more information. WEIGHT AND DIET  Eat a healthy diet  Be sure to include plenty of vegetables, fruits, low-fat dairy products, and lean protein.  Do not eat a lot of foods high in solid fats, added sugars, or salt.  Get regular exercise. This is one of the most important things you can do for your health.  Most adults should exercise for at least 150 minutes each week. The exercise should increase your heart rate and make you sweat (moderate-intensity exercise).  Most adults should also do strengthening exercises at least twice a week. This is in addition to the moderate-intensity exercise.  Maintain a healthy weight  Body mass index (BMI) is a measurement that can be used to identify possible weight problems. It estimates body fat based on height and weight. Your health care provider can help determine your BMI and help you achieve or maintain a healthy weight.  For females 25 years of age and older:   A BMI below 18.5 is considered underweight.  A BMI of 18.5 to 24.9 is normal.  A BMI of 25 to 29.9 is considered overweight.  A BMI of 30 and above is considered obese.  Watch levels of cholesterol and blood lipids  You should start having your blood tested for lipids and cholesterol at 50 years of age, then have this test every 5 years.  You may need to  have your cholesterol levels checked more often if:  Your lipid or cholesterol levels are high.  You are older than 50 years of age.  You are at high risk for heart disease.  CANCER SCREENING   Lung Cancer  Lung cancer screening is recommended for adults 50-58 years old who are at high risk for lung cancer because of a history of smoking.  A yearly low-dose CT scan of the lungs is recommended for people who:  Currently smoke.  Have quit within the past 15 years.  Have at least a 30-pack-year history of smoking. A pack year is smoking an average of one pack of cigarettes a day for 1 year.  Yearly screening should continue until it has been 15 years since you quit.  Yearly screening should stop if you develop a health problem that would prevent you from having lung cancer treatment.  Breast Cancer  Practice breast self-awareness. This means understanding how your breasts normally appear and feel.  It also means doing regular breast self-exams. Let your health care provider know about any changes, no matter how small.  If you are in your 50s or 30s, you should have a clinical breast exam (CBE) by a health care provider every 50-3 years as part of a regular health exam.  If you are 50 or older, have a CBE every year. Also consider having a breast X-ray (mammogram) every year.  If you have a family history of  history of breast cancer, talk to your health care provider about genetic screening.  If you are at high risk for breast cancer, talk to your health care provider about having an MRI and a mammogram every year.  Breast cancer gene (BRCA) assessment is recommended for women who have family members with BRCA-related cancers. BRCA-related cancers include:  Breast.  Ovarian.  Tubal.  Peritoneal cancers.  Results of the assessment will determine the need for genetic counseling and BRCA1 and BRCA2 testing. Cervical Cancer Your health care provider may recommend that you be  screened regularly for cancer of the pelvic organs (ovaries, uterus, and vagina). This screening involves a pelvic examination, including checking for microscopic changes to the surface of your cervix (Pap test). You may be encouraged to have this screening done every 3 years, beginning at age 21.  For women ages 30-65, health care providers may recommend pelvic exams and Pap testing every 3 years, or they may recommend the Pap and pelvic exam, combined with testing for human papilloma virus (HPV), every 5 years. Some types of HPV increase your risk of cervical cancer. Testing for HPV may also be done on women of any age with unclear Pap test results.  Other health care providers may not recommend any screening for nonpregnant women who are considered low risk for pelvic cancer and who do not have symptoms. Ask your health care provider if a screening pelvic exam is right for you.  If you have had past treatment for cervical cancer or a condition that could lead to cancer, you need Pap tests and screening for cancer for at least 20 years after your treatment. If Pap tests have been discontinued, your risk factors (such as having a new sexual partner) need to be reassessed to determine if screening should resume. Some women have medical problems that increase the chance of getting cervical cancer. In these cases, your health care provider may recommend more frequent screening and Pap tests. Colorectal Cancer  This type of cancer can be detected and often prevented.  Routine colorectal cancer screening usually begins at 50 years of age and continues through 50 years of age.  Your health care provider may recommend screening at an earlier age if you have risk factors for colon cancer.  Your health care provider may also recommend using home test kits to check for hidden blood in the stool.  A small camera at the end of a tube can be used to examine your colon directly (sigmoidoscopy or colonoscopy).  This is done to check for the earliest forms of colorectal cancer.  Routine screening usually begins at age 50.  Direct examination of the colon should be repeated every 5-10 years through 50 years of age. However, you may need to be screened more often if early forms of precancerous polyps or small growths are found. Skin Cancer  Check your skin from head to toe regularly.  Tell your health care provider about any new moles or changes in moles, especially if there is a change in a mole's shape or color.  Also tell your health care provider if you have a mole that is larger than the size of a pencil eraser.  Always use sunscreen. Apply sunscreen liberally and repeatedly throughout the day.  Protect yourself by wearing long sleeves, pants, a wide-brimmed hat, and sunglasses whenever you are outside. HEART DISEASE, DIABETES, AND HIGH BLOOD PRESSURE   High blood pressure causes heart disease and increases the risk of stroke. High blood pressure   is more likely to develop in:  People who have blood pressure in the high end of the normal range (130-139/85-89 mm Hg).  People who are overweight or obese.  People who are African American.  If you are 18-39 years of age, have your blood pressure checked every 3-5 years. If you are 40 years of age or older, have your blood pressure checked every year. You should have your blood pressure measured twice--once when you are at a hospital or clinic, and once when you are not at a hospital or clinic. Record the average of the two measurements. To check your blood pressure when you are not at a hospital or clinic, you can use:  An automated blood pressure machine at a pharmacy.  A home blood pressure monitor.  If you are between 55 years and 79 years old, ask your health care provider if you should take aspirin to prevent strokes.  Have regular diabetes screenings. This involves taking a blood sample to check your fasting blood sugar level.  If you  are at a normal weight and have a low risk for diabetes, have this test once every three years after 50 years of age.  If you are overweight and have a high risk for diabetes, consider being tested at a younger age or more often. PREVENTING INFECTION  Hepatitis B  If you have a higher risk for hepatitis B, you should be screened for this virus. You are considered at high risk for hepatitis B if:  You were born in a country where hepatitis B is common. Ask your health care provider which countries are considered high risk.  Your parents were born in a high-risk country, and you have not been immunized against hepatitis B (hepatitis B vaccine).  You have HIV or AIDS.  You use needles to inject street drugs.  You live with someone who has hepatitis B.  You have had sex with someone who has hepatitis B.  You get hemodialysis treatment.  You take certain medicines for conditions, including cancer, organ transplantation, and autoimmune conditions. Hepatitis C  Blood testing is recommended for:  Everyone born from 1945 through 1965.  Anyone with known risk factors for hepatitis C. Sexually transmitted infections (STIs)  You should be screened for sexually transmitted infections (STIs) including gonorrhea and chlamydia if:  You are sexually active and are younger than 50 years of age.  You are older than 50 years of age and your health care provider tells you that you are at risk for this type of infection.  Your sexual activity has changed since you were last screened and you are at an increased risk for chlamydia or gonorrhea. Ask your health care provider if you are at risk.  If you do not have HIV, but are at risk, it may be recommended that you take a prescription medicine daily to prevent HIV infection. This is called pre-exposure prophylaxis (PrEP). You are considered at risk if:  You are sexually active and do not regularly use condoms or know the HIV status of your  partner(s).  You take drugs by injection.  You are sexually active with a partner who has HIV. Talk with your health care provider about whether you are at high risk of being infected with HIV. If you choose to begin PrEP, you should first be tested for HIV. You should then be tested every 3 months for as long as you are taking PrEP.  PREGNANCY   If you are premenopausal and   become pregnant, ask your health care provider about preconception counseling.  If you may become pregnant, take 400 to 800 micrograms (mcg) of folic acid every day.  If you want to prevent pregnancy, talk to your health care provider about birth control (contraception). OSTEOPOROSIS AND MENOPAUSE   Osteoporosis is a disease in which the bones lose minerals and strength with aging. This can result in serious bone fractures. Your risk for osteoporosis can be identified using a bone density scan.  If you are 46 years of age or older, or if you are at risk for osteoporosis and fractures, ask your health care provider if you should be screened.  Ask your health care provider whether you should take a calcium or vitamin D supplement to lower your risk for osteoporosis.  Menopause may have certain physical symptoms and risks.  Hormone replacement therapy may reduce some of these symptoms and risks. Talk to your health care provider about whether hormone replacement therapy is right for you.  HOME CARE INSTRUCTIONS   Schedule regular health, dental, and eye exams.  Stay current with your immunizations.   Do not use any tobacco products including cigarettes, chewing tobacco, or electronic cigarettes.  If you are pregnant, do not drink alcohol.  If you are breastfeeding, limit how much and how often you drink alcohol.  Limit alcohol intake to no more than 1 drink per day for nonpregnant women. One drink equals 12 ounces of beer, 5 ounces of wine, or 1 ounces of hard liquor.  Do not use street drugs.  Do  not share needles.  Ask your health care provider for help if you need support or information about quitting drugs.  Tell your health care provider if you often feel depressed.  Tell your health care provider if you have ever been abused or do not feel safe at home.   This information is not intended to replace advice given to you by your health care provider. Make sure you discuss any questions you have with your health care provider.   Document Released: 10/02/2010 Document Revised: 04/09/2014 Document Reviewed: 02/18/2013 Elsevier Interactive Patient Education [employer].

## 2015-06-17 LAB — URINALYSIS W MICROSCOPIC + REFLEX CULTURE
BILIRUBIN URINE: NEGATIVE
Bacteria, UA: NONE SEEN [HPF]
Casts: NONE SEEN [LPF]
Crystals: NONE SEEN [HPF]
Glucose, UA: NEGATIVE
Hgb urine dipstick: NEGATIVE
Ketones, ur: NEGATIVE
NITRITE: NEGATIVE
PH: 7 (ref 5.0–8.0)
Protein, ur: NEGATIVE
RBC / HPF: NONE SEEN RBC/HPF (ref <=2)
SPECIFIC GRAVITY, URINE: 1.008 (ref 1.001–1.035)
Yeast: NONE SEEN [HPF]

## 2015-06-17 LAB — PAP IG AND HPV HIGH-RISK: HPV DNA High Risk: NOT DETECTED

## 2015-06-17 NOTE — Progress Notes (Signed)
Patient informed. 

## 2015-06-18 LAB — URINE CULTURE: Colony Count: 7000

## 2016-01-05 ENCOUNTER — Encounter: Payer: Self-pay | Admitting: Gastroenterology

## 2016-02-28 ENCOUNTER — Ambulatory Visit (AMBULATORY_SURGERY_CENTER): Payer: Self-pay

## 2016-02-28 ENCOUNTER — Other Ambulatory Visit: Payer: Self-pay | Admitting: Women's Health

## 2016-02-28 VITALS — Ht 62.0 in | Wt 205.8 lb

## 2016-02-28 DIAGNOSIS — Z1211 Encounter for screening for malignant neoplasm of colon: Secondary | ICD-10-CM

## 2016-02-28 DIAGNOSIS — F419 Anxiety disorder, unspecified: Secondary | ICD-10-CM

## 2016-02-28 MED ORDER — NA SULFATE-K SULFATE-MG SULF 17.5-3.13-1.6 GM/177ML PO SOLN: ORAL | 0 refills | Status: DC

## 2016-02-28 MED FILL — SUPREP BOWEL PREP KIT: 17.5-3.13-1 | 1 days supply | Qty: 354 | Fill #0

## 2016-02-28 NOTE — Telephone Encounter (Signed)
I called Rx in, spoke with pharmacy asking regarding direction of 8 tablets at bedtime. Pharmacist said directions are Take 8 tablets (2 mg total) by mouth at bedtime as needed of the 0.25

## 2016-02-28 NOTE — Progress Notes (Signed)
Per pt, no allergies to soy or egg products.Pt not taking any weight loss meds or using  O2 at home. 

## 2016-02-28 NOTE — Telephone Encounter (Signed)
Okay for refill?  

## 2016-02-28 NOTE — Telephone Encounter (Signed)
Last filled on 06/16/15 with 2 refills

## 2016-03-01 ENCOUNTER — Encounter: Payer: Self-pay | Admitting: Gastroenterology

## 2016-03-12 ENCOUNTER — Ambulatory Visit (AMBULATORY_SURGERY_CENTER): Payer: BLUE CROSS/BLUE SHIELD | Admitting: Gastroenterology

## 2016-03-12 ENCOUNTER — Encounter: Payer: Self-pay | Admitting: Gastroenterology

## 2016-03-12 VITALS — BP 104/67 | HR 85 | Temp 98.6°F | Resp 25 | Ht 65.0 in | Wt 205.0 lb

## 2016-03-12 DIAGNOSIS — Z1211 Encounter for screening for malignant neoplasm of colon: Secondary | ICD-10-CM | POA: Diagnosis present

## 2016-03-12 DIAGNOSIS — D123 Benign neoplasm of transverse colon: Secondary | ICD-10-CM

## 2016-03-12 DIAGNOSIS — Z1212 Encounter for screening for malignant neoplasm of rectum: Secondary | ICD-10-CM | POA: Diagnosis not present

## 2016-03-12 MED ORDER — SODIUM CHLORIDE 0.9 % IV SOLN: 500.0000 mL | INTRAVENOUS | Status: DC

## 2016-03-12 MED FILL — ALPRAZolam 0.25 MG TABS: 0.25 | 3 days supply | Qty: 30 | Fill #0

## 2016-03-12 NOTE — Patient Instructions (Signed)
YOU HAD AN ENDOSCOPIC PROCEDURE TODAY AT THE [facility]:   Refer to the procedure report that was given to you for any specific questions about what was found during the examination.  If the procedure report does not answer your questions, please call your gastroenterologist to clarify.  If you requested that your care partner not be given the details of your procedure findings, then the procedure report has been included in a sealed envelope for you to review at your convenience later.  YOU SHOULD EXPECT: Some feelings of bloating in the abdomen. Passage of more gas than usual.  Walking can help get rid of the air that was put into your GI tract during the procedure and reduce the bloating. If you had a lower endoscopy (such as a colonoscopy or flexible sigmoidoscopy) you may notice spotting of blood in your stool or on the toilet paper. If you underwent a bowel prep for your procedure, you may not have a normal bowel movement for a few days.  Please Note:  You might notice some irritation and congestion in your nose or some drainage.  This is from the oxygen used during your procedure.  There is no need for concern and it should clear up in a day or so.  SYMPTOMS TO REPORT IMMEDIATELY:   Following lower endoscopy (colonoscopy or flexible sigmoidoscopy):  Excessive amounts of blood in the stool  Significant tenderness or worsening of abdominal pains  Swelling of the abdomen that is new, acute  Fever of 100F or higher   For urgent or emergent issues, a gastroenterologist can be reached at any hour by calling [phone].   DIET:  We do recommend a small meal at first, but then you may proceed to your regular diet.  Drink plenty of fluids but you should avoid alcoholic beverages for 24 hours.  ACTIVITY:  You should plan to take it easy for the rest of today and you should NOT DRIVE or use heavy machinery until tomorrow (because of the sedation medicines used during the test).     FOLLOW UP: Our staff will call the number listed on your records the next business day following your procedure to check on you and address any questions or concerns that you may have regarding the information given to you following your procedure. If we do not reach you, we will leave a message.  However, if you are feeling well and you are not experiencing any problems, there is no need to return our call.  We will assume that you have returned to your regular daily activities without incident.  If any biopsies were taken you will be contacted by phone or by letter within the next 1-3 weeks.  Please call us at [phone] if you have not heard about the biopsies in 3 weeks.    SIGNATURES/CONFIDENTIALITY: You and/or your care partner have signed paperwork which will be entered into your electronic medical record.  These signatures attest to the fact that that the information above on your After Visit Summary has been reviewed and is understood.  Full responsibility of the confidentiality of this discharge information lies with you and/or your care-partner.  Read all of the handouts given to you by your recovery room nurse. 

## 2016-03-12 NOTE — Op Note (Signed)
[facility] Patient Name: Susan Morse Procedure Date: 03/12/2016 9:20 AM MRN: [id number] Endoscopist: Mallie Mussel L. Loletha Carrow , MD Age: 50 Referring MD:  Date of Birth: 11-16-1965 Gender: Female Account #: [id number] Procedure:                Colonoscopy Indications:              Screening for colorectal malignant neoplasm, This                            is the patient's first colonoscopy Medicines:                Monitored Anesthesia Care Procedure:                Pre-Anesthesia Assessment:                           - Prior to the procedure, a History and Physical                            was performed, and patient medications and                            allergies were reviewed. The patient's tolerance of                            previous anesthesia was also reviewed. The risks                            and benefits of the procedure and the sedation                            options and risks were discussed with the patient.                            All questions were answered, and informed consent                            was obtained. Prior Anticoagulants: The patient has                            taken no previous anticoagulant or antiplatelet                            agents. ASA Grade Assessment: II - A patient with                            mild systemic disease. After reviewing the risks                            and benefits, the patient was deemed in                            satisfactory condition to undergo the procedure.  After obtaining informed consent, the colonoscope                            was passed under direct vision. Throughout the                            procedure, the patient's blood pressure, pulse, and                            oxygen saturations were monitored continuously. The                            Model CF-HQ190L [id number]) scope was introduced                            through the anus and  advanced to the the cecum,                            identified by appendiceal orifice and ileocecal                            valve. The colonoscopy was performed without                            difficulty. The patient tolerated the procedure                            well. The quality of the bowel preparation was                            excellent. The ileocecal valve, appendiceal                            orifice, and rectum were photographed. The bowel                            preparation used was SUPREP. Scope In: 9:28:56 AM Scope Out: 9:41:36 AM Scope Withdrawal Time: 0 hours 9 minutes 36 seconds  Total Procedure Duration: 0 hours 12 minutes 40 seconds  Findings:                 The perianal and digital rectal examinations were                            normal.                           A 4 mm polyp was found in the distal transverse                            colon. The polyp was sessile. The polyp was removed                            with a cold snare. Resection and retrieval were  complete.                           The exam was otherwise without abnormality on                            direct and retroflexion views. Complications:            No immediate complications. Estimated Blood Loss:     Estimated blood loss: none. Impression:               - One 4 mm polyp in the distal transverse colon,                            removed with a cold snare. Resected and retrieved.                           - The examination was otherwise normal on direct                            and retroflexion views. Recommendation:           - Patient has a contact number available for                            emergencies. The signs and symptoms of potential                            delayed complications were discussed with the                            patient. Return to normal activities tomorrow.                            Written discharge  instructions were provided to the                            patient.                           - Resume previous diet.                           - Continue present medications.                           - Await pathology results.                           - Repeat colonoscopy is recommended for                            surveillance. The colonoscopy date will be                            determined after pathology results from today's  exam become available for review. Johnanthony Wilden L. Loletha Carrow, MD 03/12/2016 9:45:00 AM This report has been signed electronically.

## 2016-03-12 NOTE — Progress Notes (Signed)
Called to room to assist during endoscopic procedure.  Patient ID and intended procedure confirmed with present staff. Received instructions for my participation in the procedure from the performing physician.  

## 2016-03-13 ENCOUNTER — Telehealth: Payer: Self-pay | Admitting: *Deleted

## 2016-03-13 NOTE — Telephone Encounter (Signed)
  Follow up Call-  Call back number 03/12/2016  Post procedure Call Back phone  # [phone]  Permission to leave phone message Yes  Some recent data might be hidden     Patient questions:  Do you have a fever, pain , or abdominal swelling? No. Pain Score  0 *  Have you tolerated food without any problems? Yes.    Have you been able to return to your normal activities? Yes.    Do you have any questions about your discharge instructions: Diet   No. Medications  No. Follow up visit  No.  Do you have questions or concerns about your Care? No.  Actions: * If pain score is 4 or above: No action needed, pain <4.

## 2016-03-16 ENCOUNTER — Encounter: Payer: Self-pay | Admitting: Gastroenterology

## 2016-06-19 ENCOUNTER — Encounter: Payer: BLUE CROSS/BLUE SHIELD | Admitting: Women's Health

## 2016-08-15 ENCOUNTER — Encounter: Payer: Self-pay | Admitting: Gynecology

## 2016-10-01 ENCOUNTER — Other Ambulatory Visit: Payer: Self-pay | Admitting: Women's Health

## 2016-10-01 DIAGNOSIS — Z1231 Encounter for screening mammogram for malignant neoplasm of breast: Secondary | ICD-10-CM

## 2016-10-16 ENCOUNTER — Ambulatory Visit
Admission: RE | Admit: 2016-10-16 | Discharge: 2016-10-16 | Disposition: A | Discharge status: Home / Self Care | Payer: No Typology Code available for payment source | Source: Ambulatory Visit | Attending: Women's Health | Admitting: Women's Health

## 2016-10-16 ENCOUNTER — Other Ambulatory Visit: Payer: Self-pay | Admitting: Women's Health

## 2016-10-16 DIAGNOSIS — F419 Anxiety disorder, unspecified: Secondary | ICD-10-CM

## 2016-10-16 DIAGNOSIS — Z1231 Encounter for screening mammogram for malignant neoplasm of breast: Secondary | ICD-10-CM

## 2016-10-17 ENCOUNTER — Other Ambulatory Visit: Payer: Self-pay

## 2016-10-17 DIAGNOSIS — F419 Anxiety disorder, unspecified: Secondary | ICD-10-CM

## 2016-10-17 MED ORDER — ALPRAZOLAM 0.25 MG PO TABS: ORAL_TABLET | ORAL | 1 refills | Status: DC

## 2016-10-17 NOTE — Telephone Encounter (Signed)
I spoke with [place] about her taking 8 tablets hs. Susan Morse does not want her taking that much. Per [place] directions changed to take 1-2 tabs po q hs. Pharmacy called and notified.

## 2016-10-17 NOTE — Telephone Encounter (Signed)
Ok for refill? 

## 2016-10-17 NOTE — Telephone Encounter (Signed)
Overdue for CE. Last CE June 18, 2015. She is scheduled for November 13, 2016.

## 2016-10-17 NOTE — Telephone Encounter (Signed)
Rx called in 

## 2016-10-17 NOTE — Addendum Note (Signed)
Addended by: Ramond Craver on: 10/17/2016 01:09 PM   Modules accepted: Orders

## 2016-11-13 ENCOUNTER — Encounter: Payer: Self-pay | Admitting: Women's Health

## 2016-11-13 ENCOUNTER — Ambulatory Visit (INDEPENDENT_AMBULATORY_CARE_PROVIDER_SITE_OTHER): Payer: 59 | Admitting: Women's Health

## 2016-11-13 VITALS — BP 118/78 | Ht 65.0 in | Wt 206.0 lb

## 2016-11-13 DIAGNOSIS — Z13220 Encounter for screening for lipoid disorders: Secondary | ICD-10-CM | POA: Diagnosis not present

## 2016-11-13 DIAGNOSIS — Z01419 Encounter for gynecological examination (general) (routine) without abnormal findings: Secondary | ICD-10-CM | POA: Diagnosis not present

## 2016-11-13 LAB — CBC WITH DIFFERENTIAL/PLATELET
Basophils Absolute: 0 cells/uL (ref 0–200)
Basophils Relative: 0 %
EOS PCT: 1 %
Eosinophils Absolute: 83 cells/uL (ref 15–500)
HEMATOCRIT: 43.8 % (ref 35.0–45.0)
Hemoglobin: 14.8 g/dL (ref 11.7–15.5)
LYMPHS PCT: 23 %
Lymphs Abs: 1909 cells/uL (ref 850–3900)
MCH: 32.9 pg (ref 27.0–33.0)
MCHC: 33.8 g/dL (ref 32.0–36.0)
MCV: 97.3 fL (ref 80.0–100.0)
MONO ABS: 747 {cells}/uL (ref 200–950)
MONOS PCT: 9 %
MPV: 11.6 fL (ref 7.5–12.5)
NEUTROS PCT: 67 %
Neutro Abs: 5561 cells/uL (ref 1500–7800)
Platelets: 191 10*3/uL (ref 140–400)
RBC: 4.5 MIL/uL (ref 3.80–5.10)
RDW: 13.4 % (ref 11.0–15.0)
WBC: 8.3 10*3/uL (ref 3.8–10.8)

## 2016-11-13 MED FILL — ALPRAZolam 0.25 MG TABS: 0.25 | 15 days supply | Qty: 30 | Fill #0

## 2016-11-13 NOTE — Patient Instructions (Signed)

## 2016-11-13 NOTE — Progress Notes (Signed)
Susan Morse November 09, 1965 [id number]    History:    Presents for annual exam.  Regular monthly cycle, no missed cycles/vasectomy. Smoker/ess than half pack daily. Normal Pap and mammogram history history of normal breast MRI. Mother, sister breast cancer survivors both doing well. 03/2016 1 benign colon polyp.  Past medical history, past surgical history, family history and social history were all reviewed and documented in the EPIC chart. Works at [employer]. Has 2 sons and 2 grandchildren all doing well. Mother diabetes. Father hypertension  ROS:  A ROS was performed and pertinent positives and negatives are included.  Exam:  Vitals:   11/13/16 1439  BP: 118/78  Weight: 206 lb (93.4 kg)  Height: 5\' 5"  (1.651 m)   Body mass index is 34.28 kg/m.   General appearance:  Normal Thyroid:  Symmetrical, normal in size, without palpable masses or nodularity. Respiratory  Auscultation:  Clear without wheezing or rhonchi Cardiovascular  Auscultation:  Regular rate, without rubs, murmurs or gallops  Edema/varicosities:  Not grossly evident Abdominal  Soft,nontender, without masses, guarding or rebound.  Liver/spleen:  No organomegaly noted  Hernia:  None appreciated  Skin  Inspection:  Grossly normal   Breasts: Examined lying and sitting.     Right: Without masses, retractions, discharge or axillary adenopathy.     Left: Without masses, retractions, discharge or axillary adenopathy. Gentitourinary   Inguinal/mons:  Normal without inguinal adenopathy  External genitalia:  Normal  BUS/Urethra/Skene's glands:  Normal  Vagina:  Normal  Cervix:  Normal  Uterus:   normal in size, shape and contour.  Midline and mobile  Adnexa/parametria:     Rt: Without masses or tenderness.   Lt: Without masses or tenderness.  Anus and perineum: Normal  Digital rectal exam: Normal sphincter tone without palpated masses or tenderness  Assessment/Plan:  51 y.o. DW F G2 P2  for annual exam with no  complaints..  Regular monthly cycle/vasectomy without menopausal symptoms Obesity Smoker  Plan: Aware of hazards of smoking, tips to quit reviewed. Reviewed importance of weight loss for general health, Weight Watchers encouraged. Decrease carbs and calories and diet and increase exercise encouraged. Xanax 0.25 at bedtime when necessary aware to use sparingly uses one prescription annually. SBE's, continue annual screening mammogram, calcium rich diet, vitamin D 2000 daily encouraged. CBC, lipid panel, CMP, Pap normal 2017, new screening guidelines reviewed.    [place], 4:10 PM 11/13/2016

## 2016-11-14 LAB — COMPREHENSIVE METABOLIC PANEL
ALBUMIN: 4.2 g/dL (ref 3.6–5.1)
ALK PHOS: 50 U/L (ref 33–130)
ALT: 29 U/L (ref 6–29)
AST: 18 U/L (ref 10–35)
BUN: 8 mg/dL (ref 7–25)
CALCIUM: 9.1 mg/dL (ref 8.6–10.4)
CO2: 21 mmol/L (ref 20–32)
Chloride: 106 mmol/L (ref 98–110)
Creat: 0.9 mg/dL (ref 0.50–1.05)
Glucose, Bld: 85 mg/dL (ref 65–99)
POTASSIUM: 3.9 mmol/L (ref 3.5–5.3)
Sodium: 140 mmol/L (ref 135–146)
Total Bilirubin: 0.7 mg/dL (ref 0.2–1.2)
Total Protein: 6.3 g/dL (ref 6.1–8.1)

## 2016-11-14 LAB — URINALYSIS W MICROSCOPIC + REFLEX CULTURE
BACTERIA UA: NONE SEEN [HPF]
Bilirubin Urine: NEGATIVE
CASTS: NONE SEEN [LPF]
CRYSTALS: NONE SEEN [HPF]
Glucose, UA: NEGATIVE
Hgb urine dipstick: NEGATIVE
KETONES UR: NEGATIVE
Nitrite: NEGATIVE
PROTEIN: NEGATIVE
RBC / HPF: NONE SEEN RBC/HPF (ref <=2)
SPECIFIC GRAVITY, URINE: 1.008 (ref 1.001–1.035)
Yeast: NONE SEEN [HPF]
pH: 6.5 (ref 5.0–8.0)

## 2016-11-14 LAB — LIPID PANEL
CHOL/HDL RATIO: 4.2 ratio (ref <5.0)
CHOLESTEROL: 201 mg/dL — AB (ref <200)
HDL: 48 mg/dL — AB (ref >50)
LDL Cholesterol: 124 mg/dL — ABNORMAL HIGH (ref <100)
TRIGLYCERIDES: 147 mg/dL (ref <150)
VLDL: 29 mg/dL (ref <30)

## 2016-11-15 LAB — URINE CULTURE: ORGANISM ID, BACTERIA: NO GROWTH

## 2017-08-07 DIAGNOSIS — H353132 Nonexudative age-related macular degeneration, bilateral, intermediate dry stage: Secondary | ICD-10-CM | POA: Diagnosis not present

## 2017-11-29 ENCOUNTER — Other Ambulatory Visit: Payer: Self-pay | Admitting: Women's Health

## 2017-11-29 DIAGNOSIS — Z1231 Encounter for screening mammogram for malignant neoplasm of breast: Secondary | ICD-10-CM

## 2017-12-23 ENCOUNTER — Ambulatory Visit
Admission: RE | Admit: 2017-12-23 | Discharge: 2017-12-23 | Disposition: A | Discharge status: Home / Self Care | Payer: BLUE CROSS/BLUE SHIELD | Source: Ambulatory Visit | Attending: Women's Health | Admitting: Women's Health

## 2017-12-23 DIAGNOSIS — Z1231 Encounter for screening mammogram for malignant neoplasm of breast: Secondary | ICD-10-CM

## 2018-01-21 ENCOUNTER — Ambulatory Visit (INDEPENDENT_AMBULATORY_CARE_PROVIDER_SITE_OTHER): Payer: BLUE CROSS/BLUE SHIELD | Admitting: Women's Health

## 2018-01-21 ENCOUNTER — Encounter: Payer: Self-pay | Admitting: Women's Health

## 2018-01-21 VITALS — BP 110/80 | Ht 65.0 in | Wt 215.0 lb

## 2018-01-21 DIAGNOSIS — Z13220 Encounter for screening for lipoid disorders: Secondary | ICD-10-CM

## 2018-01-21 DIAGNOSIS — F419 Anxiety disorder, unspecified: Secondary | ICD-10-CM | POA: Diagnosis not present

## 2018-01-21 DIAGNOSIS — Z01419 Encounter for gynecological examination (general) (routine) without abnormal findings: Secondary | ICD-10-CM | POA: Diagnosis not present

## 2018-01-21 LAB — COMPREHENSIVE METABOLIC PANEL
AG RATIO: 2.1 (calc) (ref 1.0–2.5)
ALBUMIN MSPROF: 4.4 g/dL (ref 3.6–5.1)
ALT: 37 U/L — ABNORMAL HIGH (ref 6–29)
AST: 22 U/L (ref 10–35)
Alkaline phosphatase (APISO): 59 U/L (ref 33–130)
BILIRUBIN TOTAL: 0.5 mg/dL (ref 0.2–1.2)
BUN: 14 mg/dL (ref 7–25)
CALCIUM: 9.5 mg/dL (ref 8.6–10.4)
CHLORIDE: 104 mmol/L (ref 98–110)
CO2: 25 mmol/L (ref 20–32)
Creat: 0.82 mg/dL (ref 0.50–1.05)
GLOBULIN: 2.1 g/dL (ref 1.9–3.7)
GLUCOSE: 89 mg/dL (ref 65–99)
POTASSIUM: 4.5 mmol/L (ref 3.5–5.3)
SODIUM: 137 mmol/L (ref 135–146)
TOTAL PROTEIN: 6.5 g/dL (ref 6.1–8.1)

## 2018-01-21 LAB — CBC WITH DIFFERENTIAL/PLATELET
Basophils Absolute: 49 cells/uL (ref 0–200)
Basophils Relative: 0.6 %
Eosinophils Absolute: 113 cells/uL (ref 15–500)
Eosinophils Relative: 1.4 %
HCT: 44.4 % (ref 35.0–45.0)
Hemoglobin: 15.3 g/dL (ref 11.7–15.5)
Lymphs Abs: 1539 cells/uL (ref 850–3900)
MCH: 33 pg (ref 27.0–33.0)
MCHC: 34.5 g/dL (ref 32.0–36.0)
MCV: 95.9 fL (ref 80.0–100.0)
MONOS PCT: 8.8 %
MPV: 11.8 fL (ref 7.5–12.5)
Neutro Abs: 5686 cells/uL (ref 1500–7800)
Neutrophils Relative %: 70.2 %
Platelets: 242 10*3/uL (ref 140–400)
RBC: 4.63 10*6/uL (ref 3.80–5.10)
RDW: 12 % (ref 11.0–15.0)
Total Lymphocyte: 19 %
WBC mixed population: 713 cells/uL (ref 200–950)
WBC: 8.1 10*3/uL (ref 3.8–10.8)

## 2018-01-21 LAB — LIPID PANEL
CHOL/HDL RATIO: 4.3 (calc) (ref <5.0)
Cholesterol: 211 mg/dL — ABNORMAL HIGH (ref <200)
HDL: 49 mg/dL — AB (ref >50)
LDL Cholesterol (Calc): 131 mg/dL (calc) — ABNORMAL HIGH
Non-HDL Cholesterol (Calc): 162 mg/dL (calc) — ABNORMAL HIGH (ref <130)
Triglycerides: 173 mg/dL — ABNORMAL HIGH (ref <150)

## 2018-01-21 MED ORDER — ALPRAZOLAM 0.25 MG PO TABS: ORAL_TABLET | ORAL | 1 refills | Status: DC

## 2018-01-21 NOTE — Progress Notes (Addendum)
Susan Morse February 11, 1966 [id number]    History:    Presents for annual exam.  Regular monthly cycles, not sexually active, denies need for STD screen.  Smokes less than half pack per day.  Normal Pap and mammogram history.  2017 benign colon  Polyp.  Mother, sister breast cancer both survivors,BRCA testing not done.  Past medical history, past surgical history, family history and social history were all reviewed and documented in the EPIC chart.  Works at [employer].  Father deceased hypertension, heart disease.  2 sons both doing well, 4 grandchildren.  Mother, sister diabetes.  ROS:  A ROS was performed and pertinent positives and negatives are included.  Exam:  Vitals:   01/21/18 1108  BP: 110/80  Weight: 215 lb (97.5 kg)  Height: _0  (1.651 m)   Body mass index is 35.78 kg/m.   General appearance:  Normal Thyroid:  Symmetrical, normal in size, without palpable masses or nodularity. Respiratory  Auscultation:  Clear without wheezing or rhonchi Cardiovascular  Auscultation:  Regular rate, without rubs, murmurs or gallops  Edema/varicosities:  Not grossly evident Abdominal  Soft,nontender, without masses, guarding or rebound.  Liver/spleen:  No organomegaly noted  Hernia:  None appreciated  Skin  Inspection:  Grossly normal   Breasts: Examined lying and sitting.     Right: Without masses, retractions, discharge or axillary adenopathy.     Left: Without masses, retractions, discharge or axillary adenopathy. Gentitourinary   Inguinal/mons:  Normal without inguinal adenopathy  External genitalia:  Normal  BUS/Urethra/Skene's glands:  Normal  Vagina:  Normal  Cervix:  Normal  Uterus:   normal in size, shape and contour.  Midline and mobile  Adnexa/parametria:     Rt: Without masses or tenderness.   Lt: Without masses or tenderness.  Anus and perineum: Normal  Digital rectal exam: Normal sphincter tone without palpated masses or tenderness  Assessment/Plan:  52 y.o.  D WF  G2, P2 for annual exam with no complaints.  Regular monthly cycle/not sexually active Smoker less than half pack daily Obesity  Plan: Condoms encouraged if sexually active.  SBE's, continue annual screening mammogram, calcium rich foods, vitamin D 2000 daily encouraged.  Xanax 0.25 mg prescription, proper use, addictive properties reviewed, uses less than one prescription annually.  Reviewed importance of increasing regular exercise and decreasing calorie/carbs.  Aware of hazards of smoking, tips to quit reviewed.  BRCA genetic testing reviewed, mother, sister breast cancer, declines at this time will call if would like to proceed with consult.  CBC, CMP, lipid panel, Pap normal 2017, new screening guidelines reviewed.     [place], 11:11 AM 01/21/2018

## 2018-01-21 NOTE — Patient Instructions (Signed)
Health Maintenance for Postmenopausal Women Menopause is a normal process in which your reproductive ability comes to an end. This process happens gradually over a span of months to years, usually between the ages of 48 and 55. Menopause is complete when you have missed 12 consecutive menstrual periods. It is important to talk with your health care provider about some of the most common conditions that affect postmenopausal women, such as heart disease, cancer, and bone loss (osteoporosis). Adopting a healthy lifestyle and getting preventive care can help to promote your health and wellness. Those actions can also lower your chances of developing some of these common conditions. What should I know about menopause? During menopause, you may experience a number of symptoms, such as:  Moderate-to-severe hot flashes.  Night sweats.  Decrease in sex drive.  Mood swings.  Headaches.  Tiredness.  Irritability.  Memory problems.  Insomnia.  Choosing to treat or not to treat menopausal changes is an individual decision that you make with your health care provider. What should I know about hormone replacement therapy and supplements? Hormone therapy products are effective for treating symptoms that are associated with menopause, such as hot flashes and night sweats. Hormone replacement carries certain risks, especially as you become older. If you are thinking about using estrogen or estrogen with progestin treatments, discuss the benefits and risks with your health care provider. What should I know about heart disease and stroke? Heart disease, heart attack, and stroke become more likely as you age. This may be due, in part, to the hormonal changes that your body experiences during menopause. These can affect how your body processes dietary fats, triglycerides, and cholesterol. Heart attack and stroke are both medical emergencies. There are many things that you can do to help prevent heart disease  and stroke:  Have your blood pressure checked at least every 1-2 years. High blood pressure causes heart disease and increases the risk of stroke.  If you are 55-79 years old, ask your health care provider if you should take aspirin to prevent a heart attack or a stroke.  Do not use any tobacco products, including cigarettes, chewing tobacco, or electronic cigarettes. If you need help quitting, ask your health care provider.  It is important to eat a healthy diet and maintain a healthy weight. ? Be sure to include plenty of vegetables, fruits, low-fat dairy products, and lean protein. ? Avoid eating foods that are high in solid fats, added sugars, or salt (sodium).  Get regular exercise. This is one of the most important things that you can do for your health. ? Try to exercise for at least 150 minutes each week. The type of exercise that you do should increase your heart rate and make you sweat. This is known as moderate-intensity exercise. ? Try to do strengthening exercises at least twice each week. Do these in addition to the moderate-intensity exercise.  Know your numbers.Ask your health care provider to check your cholesterol and your blood glucose. Continue to have your blood tested as directed by your health care provider.  What should I know about cancer screening? There are several types of cancer. Take the following steps to reduce your risk and to catch any cancer development as early as possible. Breast Cancer  Practice breast self-awareness. ? This means understanding how your breasts normally appear and feel. ? It also means doing regular breast self-exams. Let your health care provider know about any changes, no matter how small.  If you are 40   or older, have a clinician do a breast exam (clinical breast exam or CBE) every year. Depending on your age, family history, and medical history, it may be recommended that you also have a yearly breast X-ray (mammogram).  If you  have a family history of breast cancer, talk with your health care provider about genetic screening.  If you are at high risk for breast cancer, talk with your health care provider about having an MRI and a mammogram every year.  Breast cancer (BRCA) gene test is recommended for women who have family members with BRCA-related cancers. Results of the assessment will determine the need for genetic counseling and BRCA1 and for BRCA2 testing. BRCA-related cancers include these types: ? Breast. This occurs in males or females. ? Ovarian. ? Tubal. This may also be called fallopian tube cancer. ? Cancer of the abdominal or pelvic lining (peritoneal cancer). ? Prostate. ? Pancreatic.  Cervical, Uterine, and Ovarian Cancer Your health care provider may recommend that you be screened regularly for cancer of the pelvic organs. These include your ovaries, uterus, and vagina. This screening involves a pelvic exam, which includes checking for microscopic changes to the surface of your cervix (Pap test).  For women ages 21-65, health care providers may recommend a pelvic exam and a Pap test every three years. For women ages 30-65, they may recommend the Pap test and pelvic exam, combined with testing for human papilloma virus (HPV), every five years. Some types of HPV increase your risk of cervical cancer. Testing for HPV may also be done on women of any age who have unclear Pap test results.  Other health care providers may not recommend any screening for nonpregnant women who are considered low risk for pelvic cancer and have no symptoms. Ask your health care provider if a screening pelvic exam is right for you.  If you have had past treatment for cervical cancer or a condition that could lead to cancer, you need Pap tests and screening for cancer for at least 20 years after your treatment. If Pap tests have been discontinued for you, your risk factors (such as having a new sexual partner) need to be  reassessed to determine if you should start having screenings again. Some women have medical problems that increase the chance of getting cervical cancer. In these cases, your health care provider may recommend that you have screening and Pap tests more often.  If you have a family history of uterine cancer or ovarian cancer, talk with your health care provider about genetic screening.  If you have vaginal bleeding after reaching menopause, tell your health care provider.  There are currently no reliable tests available to screen for ovarian cancer.  Lung Cancer Lung cancer screening is recommended for adults 55-80 years old who are at high risk for lung cancer because of a history of smoking. A yearly low-dose CT scan of the lungs is recommended if you:  Currently smoke.  Have a history of at least 30 pack-years of smoking and you currently smoke or have quit within the past 15 years. A pack-year is smoking an average of one pack of cigarettes per day for one year.  Yearly screening should:  Continue until it has been 15 years since you quit.  Stop if you develop a health problem that would prevent you from having lung cancer treatment.  Colorectal Cancer  This type of cancer can be detected and can often be prevented.  Routine colorectal cancer screening usually begins at   age 50 and continues through age 75.  If you have risk factors for colon cancer, your health care provider may recommend that you be screened at an earlier age.  If you have a family history of colorectal cancer, talk with your health care provider about genetic screening.  Your health care provider may also recommend using home test kits to check for hidden blood in your stool.  A small camera at the end of a tube can be used to examine your colon directly (sigmoidoscopy or colonoscopy). This is done to check for the earliest forms of colorectal cancer.  Direct examination of the colon should be repeated every  5-10 years until age 75. However, if early forms of precancerous polyps or small growths are found or if you have a family history or genetic risk for colorectal cancer, you may need to be screened more often.  Skin Cancer  Check your skin from head to toe regularly.  Monitor any moles. Be sure to tell your health care provider: ? About any new moles or changes in moles, especially if there is a change in a mole's shape or color. ? If you have a mole that is larger than the size of a pencil eraser.  If any of your family members has a history of skin cancer, especially at a Emil Weigold age, talk with your health care provider about genetic screening.  Always use sunscreen. Apply sunscreen liberally and repeatedly throughout the day.  Whenever you are outside, protect yourself by wearing long sleeves, pants, a wide-brimmed hat, and sunglasses.  What should I know about osteoporosis? Osteoporosis is a condition in which bone destruction happens more quickly than new bone creation. After menopause, you may be at an increased risk for osteoporosis. To help prevent osteoporosis or the bone fractures that can happen because of osteoporosis, the following is recommended:  If you are 19-50 years old, get at least 1,000 mg of calcium and at least 600 mg of vitamin D per day.  If you are older than age 50 but younger than age 70, get at least 1,200 mg of calcium and at least 600 mg of vitamin D per day.  If you are older than age 70, get at least 1,200 mg of calcium and at least 800 mg of vitamin D per day.  Smoking and excessive alcohol intake increase the risk of osteoporosis. Eat foods that are rich in calcium and vitamin D, and do weight-bearing exercises several times each week as directed by your health care provider. What should I know about how menopause affects my mental health? Depression may occur at any age, but it is more common as you become older. Common symptoms of depression  include:  Low or sad mood.  Changes in sleep patterns.  Changes in appetite or eating patterns.  Feeling an overall lack of motivation or enjoyment of activities that you previously enjoyed.  Frequent crying spells.  Talk with your health care provider if you think that you are experiencing depression. What should I know about immunizations? It is important that you get and maintain your immunizations. These include:  Tetanus, diphtheria, and pertussis (Tdap) booster vaccine.  Influenza every year before the flu season begins.  Pneumonia vaccine.  Shingles vaccine.  Your health care provider may also recommend other immunizations. This information is not intended to replace advice given to you by your health care provider. Make sure you discuss any questions you have with your health care provider. Document Released: 05/11/2005   Document Revised: 10/07/2015 Document Reviewed: 12/21/2014 Elsevier Interactive Patient Education  2018 [employer]  

## 2018-01-22 ENCOUNTER — Other Ambulatory Visit: Payer: Self-pay | Admitting: Women's Health

## 2018-01-22 DIAGNOSIS — E7800 Pure hypercholesterolemia, unspecified: Secondary | ICD-10-CM

## 2018-10-21 ENCOUNTER — Other Ambulatory Visit: Payer: Self-pay

## 2018-10-23 ENCOUNTER — Other Ambulatory Visit: Payer: BC Managed Care – PPO

## 2018-10-23 ENCOUNTER — Other Ambulatory Visit: Payer: Self-pay

## 2018-10-23 ENCOUNTER — Encounter (INDEPENDENT_AMBULATORY_CARE_PROVIDER_SITE_OTHER): Payer: Self-pay

## 2018-10-23 DIAGNOSIS — E7800 Pure hypercholesterolemia, unspecified: Secondary | ICD-10-CM | POA: Diagnosis not present

## 2018-10-24 LAB — LIPID PANEL
Cholesterol: 225 mg/dL — ABNORMAL HIGH (ref <200)
HDL: 44 mg/dL — ABNORMAL LOW (ref >=50)
LDL Cholesterol (Calc): 155 mg/dL (calc) — ABNORMAL HIGH
Non-HDL Cholesterol (Calc): 181 mg/dL (calc) — ABNORMAL HIGH (ref <130)
Total CHOL/HDL Ratio: 5.1 (calc) — ABNORMAL HIGH (ref <5.0)
Triglycerides: 136 mg/dL (ref <150)

## 2019-01-26 ENCOUNTER — Other Ambulatory Visit: Payer: Self-pay

## 2019-01-27 ENCOUNTER — Telehealth: Payer: Self-pay | Admitting: *Deleted

## 2019-01-27 ENCOUNTER — Ambulatory Visit (INDEPENDENT_AMBULATORY_CARE_PROVIDER_SITE_OTHER): Payer: BC Managed Care – PPO | Admitting: Women's Health

## 2019-01-27 ENCOUNTER — Encounter: Payer: Self-pay | Admitting: Women's Health

## 2019-01-27 ENCOUNTER — Other Ambulatory Visit: Payer: Self-pay

## 2019-01-27 VITALS — BP 128/80 | Ht 65.0 in | Wt 208.0 lb

## 2019-01-27 DIAGNOSIS — Z13220 Encounter for screening for lipoid disorders: Secondary | ICD-10-CM | POA: Diagnosis not present

## 2019-01-27 DIAGNOSIS — Z01419 Encounter for gynecological examination (general) (routine) without abnormal findings: Secondary | ICD-10-CM | POA: Diagnosis not present

## 2019-01-27 DIAGNOSIS — Z803 Family history of malignant neoplasm of breast: Secondary | ICD-10-CM

## 2019-01-27 LAB — LIPID PANEL
Cholesterol: 241 mg/dL — ABNORMAL HIGH (ref <200)
HDL: 51 mg/dL (ref >=50)
LDL Cholesterol (Calc): 165 mg/dL (calc) — ABNORMAL HIGH
Non-HDL Cholesterol (Calc): 190 mg/dL (calc) — ABNORMAL HIGH (ref <130)
Total CHOL/HDL Ratio: 4.7 (calc) (ref <5.0)
Triglycerides: 118 mg/dL (ref <150)

## 2019-01-27 LAB — CBC WITH DIFFERENTIAL/PLATELET
Absolute Monocytes: 508 cells/uL (ref 200–950)
Basophils Absolute: 59 cells/uL (ref 0–200)
Basophils Relative: 0.9 %
Eosinophils Absolute: 132 cells/uL (ref 15–500)
Eosinophils Relative: 2 %
HCT: 45.3 % — ABNORMAL HIGH (ref 35.0–45.0)
Hemoglobin: 15.5 g/dL (ref 11.7–15.5)
Lymphs Abs: 1320 cells/uL (ref 850–3900)
MCH: 33.1 pg — ABNORMAL HIGH (ref 27.0–33.0)
MCHC: 34.2 g/dL (ref 32.0–36.0)
MCV: 96.8 fL (ref 80.0–100.0)
MPV: 11.8 fL (ref 7.5–12.5)
Monocytes Relative: 7.7 %
Neutro Abs: 4580 cells/uL (ref 1500–7800)
Neutrophils Relative %: 69.4 %
Platelets: 189 10*3/uL (ref 140–400)
RBC: 4.68 10*6/uL (ref 3.80–5.10)
RDW: 12.3 % (ref 11.0–15.0)
Total Lymphocyte: 20 %
WBC: 6.6 10*3/uL (ref 3.8–10.8)

## 2019-01-27 LAB — COMPREHENSIVE METABOLIC PANEL
AG Ratio: 2 (calc) (ref 1.0–2.5)
ALT: 29 U/L (ref 6–29)
AST: 18 U/L (ref 10–35)
Albumin: 4.3 g/dL (ref 3.6–5.1)
Alkaline phosphatase (APISO): 52 U/L (ref 37–153)
BUN: 15 mg/dL (ref 7–25)
CO2: 25 mmol/L (ref 20–32)
Calcium: 9.2 mg/dL (ref 8.6–10.4)
Chloride: 105 mmol/L (ref 98–110)
Creat: 0.94 mg/dL (ref 0.50–1.05)
Globulin: 2.1 g/dL (calc) (ref 1.9–3.7)
Glucose, Bld: 96 mg/dL (ref 65–99)
Potassium: 4.5 mmol/L (ref 3.5–5.3)
Sodium: 139 mmol/L (ref 135–146)
Total Bilirubin: 0.5 mg/dL (ref 0.2–1.2)
Total Protein: 6.4 g/dL (ref 6.1–8.1)

## 2019-01-27 NOTE — Telephone Encounter (Signed)
Referral placed at [facility] they will call to schedule.

## 2019-01-27 NOTE — Progress Notes (Signed)
Susan Morse 05-Jan-1966 [id number]    History:    Presents for annual exam.  Regular monthly cycle, not sexually active in greater than a year, denies need for STD screen.  Normal Pap and mammogram history.  2017 benign colon polyp.  Mother, sister breast cancer survivors both with diabetes, neither of had BRCA testing done has declined testing in the past, would like a referral to talk to geneticist.  Past medical history, past surgical history, family history and social history were all reviewed and documented in the EPIC chart.  Works at [employer] , 2 children, 4 grandchildren.   ROS:  A ROS was performed and pertinent positives and negatives are included.  Exam:  Vitals:   01/27/19 0938  BP: 128/80  Weight: 208 lb (94.3 kg)  Height: _0  (1.651 m)   Body mass index is 34.61 kg/m.   General appearance:  Normal Thyroid:  Symmetrical, normal in size, without palpable masses or nodularity. Respiratory  Auscultation:  Clear without wheezing or rhonchi Cardiovascular  Auscultation:  Regular rate, without rubs, murmurs or gallops  Edema/varicosities:  Not grossly evident Abdominal  Soft,nontender, without masses, guarding or rebound.  Liver/spleen:  No organomegaly noted  Hernia:  None appreciated  Skin  Inspection:  Grossly normal   Breasts: Examined lying and sitting.     Right: Without masses, retractions, discharge or axillary adenopathy.     Left: Without masses, retractions, discharge or axillary adenopathy. Gentitourinary   Inguinal/mons:  Normal without inguinal adenopathy  External genitalia:  Normal  BUS/Urethra/Skene's glands:  Normal  Vagina:  Normal  Cervix:  Normal  Uterus:  normal in size, shape and contour.  Midline and mobile  Adnexa/parametria:     Rt: Without masses or tenderness.   Lt: Without masses or tenderness.  Anus and perineum: Normal  Digital rectal exam: Normal sphincter tone without palpated masses or tenderness  Assessment/Plan:  53 y.o. S WF  G2, P2 for annual exam with no complaints.  Regular monthly cycle/not sexually active/no menopausal symptoms Smoker less than half pack daily Obesity History of elevated cholesterol  Plan: BRCA testing discussed, will schedule referral to speak with geneticist at [facility].  SBEs, continue annual 3D screening mammogram, calcium rich foods, vitamin D 2000 daily and decrease calories/carbs encouraged.  Reviewed importance of decreasing saturated fats less than 20 g daily, increasing regular cardio type exercise 30 minutes most days of the week, red rice yeast supplement daily.  Reviewed if lipid panel remains elevated will need to see primary care for possible medication since a smoker.  Encouraged baby aspirin daily.  Aware of need to quit smoking tips for quitting discussed.  Pap normal 2017, new screening guidelines reviewed.        Huel Cote [facility], 9:51 AM 01/27/2019

## 2019-01-27 NOTE — Telephone Encounter (Signed)
-----  Message from Huel Cote, NP sent at 01/27/2019 10:07 AM EDT ----- Anderson Malta, patient needs referral to geneticist at [facility]  [place] to discuss BRCA testing, mother, sister both breast cancer survivors neither have had BRCA testing.  She works 12p - 8p  AM appointments best.

## 2019-01-27 NOTE — Patient Instructions (Addendum)
Health Maintenance, Female Adopting a healthy lifestyle and getting preventive care are important in promoting health and wellness. Ask your health care provider about:  The right schedule for you to have regular tests and exams.  Things you can do on your own to prevent diseases and keep yourself healthy. What should I know about diet, weight, and exercise? Eat a healthy diet   Eat a diet that includes plenty of vegetables, fruits, low-fat dairy products, and lean protein.  Do not eat a lot of foods that are high in solid fats, added sugars, or sodium. Maintain a healthy weight Body mass index (BMI) is used to identify weight problems. It estimates body fat based on height and weight. Your health care provider can help determine your BMI and help you achieve or maintain a healthy weight. Get regular exercise Get regular exercise. This is one of the most important things you can do for your health. Most adults should:  Exercise for at least 150 minutes each week. The exercise should increase your heart rate and make you sweat (moderate-intensity exercise).  Do strengthening exercises at least twice a week. This is in addition to the moderate-intensity exercise.  Spend less time sitting. Even light physical activity can be beneficial. Watch cholesterol and blood lipids Have your blood tested for lipids and cholesterol at 53 years of age, then have this test every 5 years. Have your cholesterol levels checked more often if:  Your lipid or cholesterol levels are high.  You are older than 53 years of age.  You are at high risk for heart disease. What should I know about cancer screening? Depending on your health history and family history, you may need to have cancer screening at various ages. This may include screening for:  Breast cancer.  Cervical cancer.  Colorectal cancer.  Skin cancer.  Lung cancer. What should I know about heart disease, diabetes, and high blood  pressure? Blood pressure and heart disease  High blood pressure causes heart disease and increases the risk of stroke. This is more likely to develop in people who have high blood pressure readings, are of African descent, or are overweight.  Have your blood pressure checked: ? Every 3-5 years if you are 68-56 years of age. ? Every year if you are 44 years old or older. Diabetes Have regular diabetes screenings. This checks your fasting blood sugar level. Have the screening done:  Once every three years after age 35 if you are at a normal weight and have a low risk for diabetes.  More often and at a younger age if you are overweight or have a high risk for diabetes. What should I know about preventing infection? Hepatitis B If you have a higher risk for hepatitis B, you should be screened for this virus. Talk with your health care provider to find out if you are at risk for hepatitis B infection. Hepatitis C Testing is recommended for:  Everyone born from [birth year] through 1965.  Anyone with known risk factors for hepatitis C. Sexually transmitted infections (STIs)  Get screened for STIs, including gonorrhea and chlamydia, if: ? You are sexually active and are younger than 53 years of age. ? You are older than 53 years of age and your health care provider tells you that you are at risk for this type of infection. ? Your sexual activity has changed since you were last screened, and you are at increased risk for chlamydia or gonorrhea. Ask your health care provider if  you are at risk.  Ask your health care provider about whether you are at high risk for HIV. Your health care provider may recommend a prescription medicine to help prevent HIV infection. If you choose to take medicine to prevent HIV, you should first get tested for HIV. You should then be tested every 3 months for as long as you are taking the medicine. Pregnancy  If you are about to stop having your period (premenopausal) and  you may become pregnant, seek counseling before you get pregnant.  Take 400 to 800 micrograms (mcg) of folic acid every day if you become pregnant.  Ask for birth control (contraception) if you want to prevent pregnancy. Osteoporosis and menopause Osteoporosis is a disease in which the bones lose minerals and strength with aging. This can result in bone fractures. If you are 12 years old or older, or if you are at risk for osteoporosis and fractures, ask your health care provider if you should:  Be screened for bone loss.  Take a calcium or vitamin D supplement to lower your risk of fractures.  Be given hormone replacement therapy (HRT) to treat symptoms of menopause. Follow these instructions at home: Lifestyle  Do not use any products that contain nicotine or tobacco, such as cigarettes, e-cigarettes, and chewing tobacco. If you need help quitting, ask your health care provider.  Do not use street drugs.  Do not share needles.  Ask your health care provider for help if you need support or information about quitting drugs. Alcohol use  Do not drink alcohol if: ? Your health care provider tells you not to drink. ? You are pregnant, may be pregnant, or are planning to become pregnant.  If you drink alcohol: ? Limit how much you use to 0-1 drink a day. ? Limit intake if you are breastfeeding.  Be aware of how much alcohol is in your drink. In the [place] one drink equals one 12 oz bottle of beer (355 mL), one 5 oz glass of wine (148 mL), or one 1 oz glass of hard liquor (44 mL). General instructions  Schedule regular health, dental, and eye exams.  Stay current with your vaccines.  Tell your health care provider if: ? You often feel depressed. ? You have ever been abused or do not feel safe at home. Summary  Adopting a healthy lifestyle and getting preventive care are important in promoting health and wellness.  Follow your health care provider's instructions about healthy  diet, exercising, and getting tested or screened for diseases.  Follow your health care provider's instructions on monitoring your cholesterol and blood pressure. This information is not intended to replace advice given to you by your health care provider. Make sure you discuss any questions you have with your health care provider. Document Released: 10/02/2010 Document Revised: 03/12/2018 Document Reviewed: 03/12/2018 Elsevier Patient Education  2020 [place] and Cholesterol Restricted Eating Plan Getting too much fat and cholesterol in your diet may cause health problems. Choosing the right foods helps keep your fat and cholesterol at normal levels. This can keep you from getting certain diseases. Your doctor may recommend an eating plan that includes:  Total fat: ______% or less of total calories a day.  Saturated fat: ______% or less of total calories a day.  Cholesterol: less than _________mg a day.  Fiber: ______g a day. What are tips for following this plan? Meal planning  At meals, divide your plate into four equal parts: ? Fill one-half  of your plate with vegetables and green salads. ? Fill one-fourth of your plate with whole grains. ? Fill one-fourth of your plate with low-fat (lean) protein foods.  Eat fish that is high in omega-3 fats at least two times a week. This includes mackerel, tuna, sardines, and salmon.  Eat foods that are high in fiber, such as whole grains, beans, apples, broccoli, carrots, peas, and barley. General tips   Work with your doctor to lose weight if you need to.  Avoid: ? Foods with added sugar. ? Fried foods. ? Foods with partially hydrogenated oils.  Limit alcohol intake to no more than 1 drink a day for nonpregnant women and 2 drinks a day for men. One drink equals 12 oz of beer, 5 oz of wine, or 1 oz of hard liquor. Reading food labels  Check food labels for: ? Trans fats. ? Partially hydrogenated oils. ? Saturated fat  (g) in each serving. ? Cholesterol (mg) in each serving. ? Fiber (g) in each serving.  Choose foods with healthy fats, such as: ? Monounsaturated fats. ? Polyunsaturated fats. ? Omega-3 fats.  Choose grain products that have whole grains. Look for the word "whole" as the first word in the ingredient list. Cooking  Cook foods using low-fat methods. These include baking, boiling, grilling, and broiling.  Eat more home-cooked foods. Eat at restaurants and buffets less often.  Avoid cooking using saturated fats, such as butter, cream, palm oil, palm kernel oil, and coconut oil. Recommended foods  Fruits  All fresh, canned (in natural juice), or frozen fruits. Vegetables  Fresh or frozen vegetables (raw, steamed, roasted, or grilled). Green salads. Grains  Whole grains, such as whole wheat or whole grain breads, crackers, cereals, and pasta. Unsweetened oatmeal, bulgur, barley, quinoa, or brown rice. Corn or whole wheat flour tortillas. Meats and other protein foods  Ground beef (85% or leaner), grass-fed beef, or beef trimmed of fat. Skinless chicken or [place]. Ground chicken or [place]. Pork trimmed of fat. All fish and seafood. Egg whites. Dried beans, peas, or lentils. Unsalted nuts or seeds. Unsalted canned beans. Nut butters without added sugar or oil. Dairy  Low-fat or nonfat dairy products, such as skim or 1% milk, 2% or reduced-fat cheeses, low-fat and fat-free ricotta or cottage cheese, or plain low-fat and nonfat yogurt. Fats and oils  Tub margarine without trans fats. Light or reduced-fat mayonnaise and salad dressings. Avocado. Olive, canola, sesame, or safflower oils. The items listed above may not be a complete list of foods and beverages you can eat. Contact a dietitian for more information. Foods to avoid Fruits  Canned fruit in heavy syrup. Fruit in cream or butter sauce. Fried fruit. Vegetables  Vegetables cooked in cheese, cream, or butter sauce. Fried  vegetables. Grains  White bread. White pasta. White rice. Cornbread. Bagels, pastries, and croissants. Crackers and snack foods that contain trans fat and hydrogenated oils. Meats and other protein foods  Fatty cuts of meat. Ribs, chicken wings, bacon, sausage, bologna, salami, chitterlings, fatback, hot dogs, bratwurst, and packaged lunch meats. Liver and organ meats. Whole eggs and egg yolks. Chicken and [place] with skin. Fried meat. Dairy  Whole or 2% milk, cream, half-and-half, and cream cheese. Whole milk cheeses. Whole-fat or sweetened yogurt. Full-fat cheeses. Nondairy creamers and whipped toppings. Processed cheese, cheese spreads, and cheese curds. Beverages  Alcohol. Sugar-sweetened drinks such as sodas, lemonade, and fruit drinks. Fats and oils  Butter, stick margarine, lard, shortening, ghee, or bacon fat. Coconut, palm  kernel, and palm oils. Sweets and desserts  Corn syrup, sugars, honey, and molasses. Candy. Jam and jelly. Syrup. Sweetened cereals. Cookies, pies, cakes, donuts, muffins, and ice cream. The items listed above may not be a complete list of foods and beverages you should avoid. Contact a dietitian for more information. Summary  Choosing the right foods helps keep your fat and cholesterol at normal levels. This can keep you from getting certain diseases.  At meals, fill one-half of your plate with vegetables and green salads.  Eat high-fiber foods, like whole grains, beans, apples, carrots, peas, and barley.  Limit added sugar, saturated fats, alcohol, and fried foods. This information is not intended to replace advice given to you by your health care provider. Make sure you discuss any questions you have with your health care provider. Document Released: 09/18/2011 Document Revised: 11/20/2017 Document Reviewed: 12/04/2016 Elsevier Patient Education  2020 [employer].

## 2019-01-28 ENCOUNTER — Other Ambulatory Visit: Payer: Self-pay | Admitting: Women's Health

## 2019-01-28 DIAGNOSIS — Z1231 Encounter for screening mammogram for malignant neoplasm of breast: Secondary | ICD-10-CM

## 2019-01-29 ENCOUNTER — Telehealth: Payer: Self-pay | Admitting: Genetic Counselor

## 2019-01-29 NOTE — Telephone Encounter (Signed)
A genetic counseling appt has been scheduled for Susan Morse to see Roma Kayser on 11/11 at 9am. She's been made aware to arrive 15 minutes early.

## 2019-01-29 NOTE — Telephone Encounter (Signed)
Patient scheduled on 02/11/19 @ 9:00am

## 2019-02-09 ENCOUNTER — Telehealth: Payer: Self-pay | Admitting: Genetic Counselor

## 2019-02-09 NOTE — Telephone Encounter (Signed)
Called patient regarding upcoming [facility] appointment, per patient's request this will be a walk-in visit. °

## 2019-02-11 ENCOUNTER — Inpatient Hospital Stay: Payer: BC Managed Care – PPO

## 2019-02-11 ENCOUNTER — Other Ambulatory Visit: Payer: Self-pay

## 2019-02-11 ENCOUNTER — Encounter: Payer: Self-pay | Admitting: Genetic Counselor

## 2019-02-11 ENCOUNTER — Inpatient Hospital Stay: Payer: BC Managed Care – PPO | Attending: Genetic Counselor | Admitting: Genetic Counselor

## 2019-02-11 DIAGNOSIS — Z8041 Family history of malignant neoplasm of ovary: Secondary | ICD-10-CM | POA: Diagnosis not present

## 2019-02-11 DIAGNOSIS — Z803 Family history of malignant neoplasm of breast: Secondary | ICD-10-CM

## 2019-02-11 NOTE — Progress Notes (Signed)
REFERRING PROVIDER: Huel Cote, NP [address],  [place] [address]  PRIMARY PROVIDER:  Huel Cote, NP  PRIMARY REASON FOR VISIT:  1. Family history of breast cancer   2. Family history of ovarian cancer      HISTORY OF PRESENT ILLNESS:   Ms. Nikolov, a 53 y.o. female, was seen for a [place] cancer genetics consultation at the request of Dr. Annamaria Boots due to a family history of breast and ovarian cancer.  Ms. Gellerman presents to clinic today to discuss the possibility of a hereditary predisposition to cancer, genetic testing, and to further clarify her future cancer risks, as well as potential cancer risks for family members.   Ms. Rother is a 54 y.o. female with no personal history of cancer.  She reports that her doctor has asked her to come get this testing in the past, but she is now just getting around to it.  CANCER HISTORY:  Oncology History   No history exists.     RISK FACTORS:  Menarche was at age 75.  First live birth at age 73.  OCP use for approximately <1 years.  Ovaries intact: yes.  Hysterectomy: no.  Menopausal status: perimenopausal.  HRT use: 0 years. Colonoscopy: yes; a few polyps. Mammogram within the last year: yes. Number of breast biopsies: 0. Up to date with pelvic exams: yes. Any excessive radiation exposure in the past: no  Past Medical History:  Diagnosis Date   Family history of breast cancer    Family history of ovarian cancer    Smoker    1/2 PPD   Stress at work    uses Xanax.    Past Surgical History:  Procedure Laterality Date   HAND SURGERY  2002   CTR both hands   WISDOM TOOTH EXTRACTION      Social History   Socioeconomic History   Marital status: Divorced    Spouse name: Not on file   Number of children: Not on file   Years of education: Not on file   Highest education level: Not on file  Occupational History   Not on file  Social Needs   Financial resource strain: Not on  file   Food insecurity    Worry: Not on file    Inability: Not on file   Transportation needs    Medical: Not on file    Non-medical: Not on file  Tobacco Use   Smoking status: Current Every Day Smoker    Packs/day: 0.50    Years: 30.00    Pack years: 15.00    Types: Cigarettes   Smokeless tobacco: Never Used  Substance and Sexual Activity   Alcohol use: Yes    Alcohol/week: 0.0 standard drinks    Comment: SOCIALLY ONLY   Drug use: No   Sexual activity: Not Currently    Birth control/protection: Other-see comments    Comment: PARTNER VASECTOMY, intercourse age 35, declined insurance question pertaning to amountof sexual partners,des neg  Lifestyle   Physical activity    Days per week: Not on file    Minutes per session: Not on file   Stress: Not on file  Relationships   Social connections    Talks on phone: Not on file    Gets together: Not on file    Attends religious service: Not on file    Active member of club or organization: Not on file    Attends meetings of clubs or organizations: Not on  file    Relationship status: Not on file  Other Topics Concern   Not on file  Social History Narrative   Not on file     FAMILY HISTORY:  We obtained a detailed, 4-generation family history.  Significant diagnoses are listed below: Family History  Problem Relation Age of Onset   Breast cancer Mother        Age 8   Hypertension Father    Heart disease Father    Cancer Other 83       UNKNOWN TYPE OF CANCER   Breast cancer Sister        Age 31   Pneumonia Maternal Uncle        d. 38   Ovarian cancer Paternal Aunt    Other Maternal Grandfather        blood clot   Heart attack Paternal Grandfather    Esophageal cancer Maternal Uncle        spread to lungs   Breast cancer Cousin 68       mat first cousin   Colon cancer Neg Hx    Rectal cancer Neg Hx     The patient has two sons who are cancer free.  Her oldest had a place on his neck that  was biopsied but since she has not heard more about it she presumes it was not cancerous.  She has two sisters and three brothers.  One sister was diagnosed with breast cancer at 66.  One brother had a daughter who had a tumor on her hand diagnosed at age 36 that was cancerous.  Her hand was removed, but the cancer came back and she died at age 44.  Her mother is living and her father is deceased.  The patient's father died of a heart attack at age 47.  He had three brothers and three sisters.  One sister died of ovarian cancer and one brother had a daughter who had an unknown cancer. The paternal grandparents are deceased from non-cancer related issues.  The patient's mother was diagnosed with breast cancer at 34.  She has not had genetic testing.  She has one sister and two brothers.  One brother died of lung cancer and the other from pneumonia.  The sister has a daughter who had breast cancer at 56.  The maternal grandparents are deceased.  The grandmother had two sisters who each had one daughter with breast cancer.  Ms. Porrata is unaware of previous family history of genetic testing for hereditary cancer risks. Patient's maternal ancestors are of Caucasian descent, and paternal ancestors are of [place] descent. There is no reported Ashkenazi Jewish ancestry. There is no known consanguinity.  GENETIC COUNSELING ASSESSMENT: Ms. Halfhill is a 53 y.o. female with a family history of breast and ovarian which is somewhat suggestive of a hereditary cancer syndrome and predisposition to cancer given the young age of onset of breast cancer in her mother and the number of women diagnosed on the maternal side of the family. We, therefore, discussed and recommended the following at today's visit.   DISCUSSION: We discussed that 5 - 10% of breast cancer is hereditary, with most cases associated with BRCA mutations.  There are other genes that can be associated with hereditary breast cancer syndromes.  We discussed that  testing is beneficial for several reasons including knowing how to follow individuals after completing their treatment, and understand if other family members could be at risk for cancer and allow them to undergo genetic  testing.   We reviewed the characteristics, features and inheritance patterns of hereditary cancer syndromes. We also discussed genetic testing, including the appropriate family members to test, the process of testing, insurance coverage and turn-around-time for results. We discussed the implications of a negative, positive, carrier and/or variant of uncertain significant result. We recommended Ms. Eagon pursue genetic testing for the common hereditary gene panel. The Common Hereditary Gene Panel offered by [facility] includes sequencing and/or deletion duplication testing of the following 48 genes: APC, ATM, AXIN2, BARD1, BMPR1A, BRCA1, BRCA2, BRIP1, CDH1, CDK4, CDKN2A (p14ARF), CDKN2A (p16INK4a), CHEK2, CTNNA1, DICER1, EPCAM (Deletion/duplication testing only), GREM1 (promoter region deletion/duplication testing only), KIT, MEN1, MLH1, MSH2, MSH3, MSH6, MUTYH, NBN, NF1, NHTL1, PALB2, PDGFRA, PMS2, POLD1, POLE, PTEN, RAD50, RAD51C, RAD51D, RNF43, SDHB, SDHC, SDHD, SMAD4, SMARCA4. STK11, TP53, TSC1, TSC2, and VHL.  The following genes were evaluated for sequence changes only: SDHA and HOXB13 c.251G>A variant only.   Based on Ms. Rentfrow's family history of cancer, she meets medical criteria for genetic testing. Despite that she meets criteria, she may still have an out of pocket cost. We discussed that if her out of pocket cost for testing is over $100, the laboratory will call and confirm whether she wants to proceed with testing.  If the out of pocket cost of testing is less than $100 she will be billed by the genetic testing laboratory.   We discussed that some people do not want to undergo genetic testing due to fear of genetic discrimination.  A federal law called the Genetic Information  Non-Discrimination Act (GINA) of 2008 helps protect individuals against genetic discrimination based on their genetic test results.  It impacts both health insurance and employment.  With health insurance, it protects against increased premiums, being kicked off insurance or being forced to take a test in order to be insured.  For employment it protects against hiring, firing and promoting decisions based on genetic test results.  Health status due to a cancer diagnosis is not protected under GINA.   Based on the patient's family history, a statistical model (Tyrer Cusik) was used to estimate her risk of developing breast cancer. This estimates her lifetime risk of developing breast cancer to be approximately 32.4%. This estimation does not consider any genetic testing results.  The patient's lifetime breast cancer risk is a preliminary estimate based on available information using one of several models endorsed by the [place] (ACS). The ACS recommends consideration of breast MRI screening as an adjunct to mammography for patients at high risk (defined as 20% or greater lifetime risk).   Ms. Mendonca has been determined to be at high risk for breast cancer.  Therefore, we recommend that annual screening with mammography and breast MRI be performed.  We discussed that Ms. Paccione should discuss her individual situation with her referring physician and determine a breast cancer screening plan with which they are both comfortable.  If you feel that she would like a referral to the high risk breast clinic at the [facility] you may refer her there.    PLAN: Despite our recommendation, Ms. Neddo did not wish to pursue genetic testing at today's visit. She would like to talk with her sister first.  We understand this decision and remain available to coordinate genetic testing at any time in the future. We, therefore, recommend Ms. Amalfitano continue to follow the cancer screening guidelines given by  her primary healthcare provider.  Based on Ms. Viles's family history, we recommended her  mother, who was diagnosed with breast cancer at age 70, have genetic counseling and testing. Ms. Scadden will let us know if we can be of any assistance in coordinating genetic counseling and/or testing for this family member.   Lastly, we encouraged Ms. Mcadam to remain in contact with cancer genetics annually so that we can continuously update the family history and inform her of any changes in cancer genetics and testing that may be of benefit for this family.   Ms. Standard questions were answered to her satisfaction today. Our contact information was provided should additional questions or concerns arise. Thank you for the referral and allowing [place] to share in the care of your patient.   Markos Theil P. Florene Glen, [place], [facility] Licensed, Insurance risk surveyor [email or web address] phone: [phone]  The patient was seen for a total of 60 minutes in face-to-face genetic counseling.  This patient was discussed with Drs. Magrinat, Lindi Adie and/or Burr Medico who agrees with the above.    _______________________________________________________________________ For Office Staff:  Number of people involved in session: 1 Was an Intern/ student involved with case: yes Delorise Royals

## 2019-02-16 NOTE — Progress Notes (Signed)
Telephone call to review BRCA testing and genetic counseling session, unsure if sister had BRCA testing, has numerous family members with cancer several with breast cancer, overall breast cancer risk is elevated 30%, discussed breast MRI, continue annual 3D mammograms.  BRCA risk less than 1%, not covered by insurance test greater than $3000.  Unsure if she is going to pursue at this time.

## 2019-03-19 ENCOUNTER — Ambulatory Visit
Admission: RE | Admit: 2019-03-19 | Discharge: 2019-03-19 | Disposition: A | Discharge status: Home / Self Care | Payer: BC Managed Care – PPO | Source: Ambulatory Visit | Attending: Women's Health | Admitting: Women's Health

## 2019-03-19 ENCOUNTER — Other Ambulatory Visit: Payer: Self-pay

## 2019-03-19 DIAGNOSIS — Z1231 Encounter for screening mammogram for malignant neoplasm of breast: Secondary | ICD-10-CM

## 2020-02-02 ENCOUNTER — Ambulatory Visit (INDEPENDENT_AMBULATORY_CARE_PROVIDER_SITE_OTHER): Payer: 59 | Admitting: Nurse Practitioner

## 2020-02-02 ENCOUNTER — Other Ambulatory Visit: Payer: Self-pay

## 2020-02-02 ENCOUNTER — Encounter: Payer: Self-pay | Admitting: Nurse Practitioner

## 2020-02-02 VITALS — BP 128/80 | Ht 65.0 in | Wt 215.0 lb

## 2020-02-02 DIAGNOSIS — Z124 Encounter for screening for malignant neoplasm of cervix: Secondary | ICD-10-CM | POA: Diagnosis not present

## 2020-02-02 DIAGNOSIS — E785 Hyperlipidemia, unspecified: Secondary | ICD-10-CM

## 2020-02-02 DIAGNOSIS — Z01419 Encounter for gynecological examination (general) (routine) without abnormal findings: Secondary | ICD-10-CM

## 2020-02-02 DIAGNOSIS — Z803 Family history of malignant neoplasm of breast: Secondary | ICD-10-CM | POA: Diagnosis not present

## 2020-02-02 DIAGNOSIS — F419 Anxiety disorder, unspecified: Secondary | ICD-10-CM

## 2020-02-02 MED ORDER — ALPRAZOLAM 0.25 MG PO TABS: ORAL_TABLET | ORAL | 0 refills | Status: DC

## 2020-02-02 NOTE — Progress Notes (Signed)
   Susan Morse 10-23-1965 [id number]   History:  54 y.o. G2P2002 presents for annual exam without GYN complaints. Postmenopausal - no HRT, no bleeding. Normal pap and mammogram history. Mother and sister with history of breast cancer. She had genetic counseling 1 year ago but decided to not pursue genetic testing at that time. Has had more stress lately with work and caring for her mother. Takes xanax rarely for anxiety/insomnia. She had elevated lipids last year with recommendations to find PCP for management and work on diet and exercise. Smoker.  Gynecologic History Patient's last menstrual period was 02/18/2019. Contraception: post menopausal status Last Pap: 06/2015. Results were: normal Last mammogram: 03/20/2019. Results were: normal Last colonoscopy: 03/2016. Results were: benign polyp Last Dexa: Never  Past medical history, past surgical history, family history and social history were all reviewed and documented in the EPIC chart.  ROS:  A ROS was performed and pertinent positives and negatives are included.  Exam:  Vitals:   02/02/20 0917  BP: 128/80  Weight: 215 lb (97.5 kg)  Height: 5\' 5"  (1.651 m)   Body mass index is 35.78 kg/m.  General appearance:  Normal Thyroid:  Symmetrical, normal in size, without palpable masses or nodularity. Respiratory  Auscultation:  Clear without wheezing or rhonchi Cardiovascular  Auscultation:  Regular rate, without rubs, murmurs or gallops  Edema/varicosities:  Not grossly evident Abdominal  Soft,nontender, without masses, guarding or rebound.  Liver/spleen:  No organomegaly noted  Hernia:  None appreciated  Skin  Inspection:  Grossly normal   Breasts: Examined lying and sitting.   Right: Without masses, retractions, discharge or axillary adenopathy.   Left: Without masses, retractions, discharge or axillary adenopathy. Gentitourinary   Inguinal/mons:  Normal without inguinal adenopathy  External genitalia:   Normal  BUS/Urethra/Skene's glands:  Normal  Vagina:  Atrophic changes  Cervix:  Normal  Uterus:  Normal in size, shape and contour.  Midline and mobile  Adnexa/parametria:     Rt: Without masses or tenderness.   Lt: Without masses or tenderness.  Anus and perineum: Normal  Digital rectal exam: Normal sphincter tone without palpated masses or tenderness  Assessment/Plan:  54 y.o. [id number] for annual exam.   Well female exam with routine gynecological exam - Plan: CBC with Differential/Platelet, Comprehensive metabolic panel. Education provided on SBEs, importance of preventative screenings, current guidelines, high calcium diet, regular exercise, and multivitamin daily. She feels she is not at a place to stop smoking due to increased stress with work and her mom.   Encounter for Papanicolaou smear for cervical cancer screening - Normal pap history. Pap with HPV typing today.   Family history of breast cancer in first degree relative - Mother and sister with history of breast cancer. She had genetic counseling 1 year ago but decided not to pursue genetic testing at that time. Normal mammogram history. Continue annual screenings. Normal breast exam today.   Anxiety - Plan: ALPRAZolam (XANAX) 0.25 MG tablet as needed. Takes rarely. Last refill was 2 years ago. #30 with no refills prescribed today.   Hyperlipidemia, unspecified hyperlipidemia type - Plan: Lipid panel. We discussed medication management if these still remain high.   Screening for colon cancer - 2017 benign polyp. Will repeat screening colonoscopy at 5-year interval per guidelines.   Follow up in 1 year for annual.         Tamela Gammon [facility], 9:26 AM 02/02/2020

## 2020-02-02 NOTE — Patient Instructions (Signed)

## 2020-02-02 NOTE — Addendum Note (Signed)
Addended by: Lorine Bears on: 02/02/2020 09:54 AM   Modules accepted: Orders

## 2020-02-03 LAB — CBC WITH DIFFERENTIAL/PLATELET
Absolute Monocytes: 672 cells/uL (ref 200–950)
Basophils Absolute: 42 cells/uL (ref 0–200)
Basophils Relative: 0.6 %
Eosinophils Absolute: 189 cells/uL (ref 15–500)
Eosinophils Relative: 2.7 %
HCT: 46.9 % — ABNORMAL HIGH (ref 35.0–45.0)
Hemoglobin: 16 g/dL — ABNORMAL HIGH (ref 11.7–15.5)
Lymphs Abs: 1197 cells/uL (ref 850–3900)
MCH: 32.7 pg (ref 27.0–33.0)
MCHC: 34.1 g/dL (ref 32.0–36.0)
MCV: 95.7 fL (ref 80.0–100.0)
MPV: 11.5 fL (ref 7.5–12.5)
Monocytes Relative: 9.6 %
Neutro Abs: 4900 cells/uL (ref 1500–7800)
Neutrophils Relative %: 70 %
Platelets: 223 10*3/uL (ref 140–400)
RBC: 4.9 10*6/uL (ref 3.80–5.10)
RDW: 12.3 % (ref 11.0–15.0)
Total Lymphocyte: 17.1 %
WBC: 7 10*3/uL (ref 3.8–10.8)

## 2020-02-03 LAB — PAP, TP IMAGING W/ HPV RNA, RFLX HPV TYPE 16,18/45: HPV DNA High Risk: NOT DETECTED

## 2020-02-03 LAB — COMPREHENSIVE METABOLIC PANEL WITH GFR
AG Ratio: 2 (calc) (ref 1.0–2.5)
ALT: 30 U/L — ABNORMAL HIGH (ref 6–29)
AST: 18 U/L (ref 10–35)
Albumin: 4.6 g/dL (ref 3.6–5.1)
Alkaline phosphatase (APISO): 65 U/L (ref 37–153)
BUN: 12 mg/dL (ref 7–25)
CO2: 26 mmol/L (ref 20–32)
Calcium: 10.1 mg/dL (ref 8.6–10.4)
Chloride: 101 mmol/L (ref 98–110)
Creat: 1.03 mg/dL (ref 0.50–1.05)
Globulin: 2.3 g/dL (ref 1.9–3.7)
Glucose, Bld: 96 mg/dL (ref 65–99)
Potassium: 4.2 mmol/L (ref 3.5–5.3)
Sodium: 138 mmol/L (ref 135–146)
Total Bilirubin: 0.8 mg/dL (ref 0.2–1.2)
Total Protein: 6.9 g/dL (ref 6.1–8.1)

## 2020-02-03 LAB — LIPID PANEL
Cholesterol: 245 mg/dL — ABNORMAL HIGH
HDL: 55 mg/dL
LDL Cholesterol (Calc): 160 mg/dL — ABNORMAL HIGH
Non-HDL Cholesterol (Calc): 190 mg/dL — ABNORMAL HIGH
Total CHOL/HDL Ratio: 4.5 (calc)
Triglycerides: 151 mg/dL — ABNORMAL HIGH

## 2020-07-25 ENCOUNTER — Other Ambulatory Visit: Payer: Self-pay | Admitting: Nurse Practitioner

## 2020-07-25 DIAGNOSIS — Z1231 Encounter for screening mammogram for malignant neoplasm of breast: Secondary | ICD-10-CM

## 2020-09-15 ENCOUNTER — Ambulatory Visit
Admission: RE | Admit: 2020-09-15 | Discharge: 2020-09-15 | Disposition: A | Discharge status: Home / Self Care | Payer: 59 | Source: Ambulatory Visit | Attending: Nurse Practitioner | Admitting: Nurse Practitioner

## 2020-09-15 ENCOUNTER — Other Ambulatory Visit: Payer: Self-pay

## 2020-09-15 DIAGNOSIS — Z1231 Encounter for screening mammogram for malignant neoplasm of breast: Secondary | ICD-10-CM

## 2020-09-20 ENCOUNTER — Other Ambulatory Visit: Payer: Self-pay | Admitting: Nurse Practitioner

## 2020-09-20 DIAGNOSIS — F419 Anxiety disorder, unspecified: Secondary | ICD-10-CM

## 2020-09-21 MED ORDER — ALPRAZOLAM 0.25 MG PO TABS: ORAL_TABLET | ORAL | 0 refills | Status: AC

## 2021-02-02 ENCOUNTER — Ambulatory Visit: Payer: 59 | Admitting: Nurse Practitioner

## 2021-04-05 ENCOUNTER — Ambulatory Visit: Payer: 59 | Admitting: Nurse Practitioner

## 2021-04-12 NOTE — Progress Notes (Signed)
Susan Morse 01/13/1966 [id number]   History:  56 y.o. G2P2002 presents for annual exam without GYN complaints. Postmenopausal - no HRT, no bleeding. Normal pap and mammogram history. Susan Morse (age 51) and Susan Morse (age 71) with history of breast cancer. She had genetic counseling but decided to not pursue genetic testing at that time. Hyperlipidemia with recommendations to establish with PCP but she has not done so. Smoker. Father deceased from heart attack.   Gynecologic History Patient's last menstrual period was 02/18/2019. Contraception: post menopausal status Sexually active: Yes  Health maintenance Last Pap: 02/02/2020. Results were: Normal, 5-year repeat Last mammogram: 09/15/2020. Results were: Normal Last colonoscopy: 03/12/2016. Results were: Adenoma, 5-year recall Last Dexa: Not indicated  Past medical history, past surgical history, family history and social history were all reviewed and documented in the EPIC chart. Divorced. 2 children live locally, 4 grandchildren ages 10-15.   ROS:  A ROS was performed and pertinent positives and negatives are included.  Exam:  Vitals:   04/13/21 0959  BP: 124/80  Weight: 203 lb (92.1 kg)  Height: 5\' 1"  (1.549 m)    Body mass index is 38.36 kg/m.  General appearance:  Normal Thyroid:  Symmetrical, normal in size, without palpable masses or nodularity. Respiratory  Auscultation:  Clear without wheezing or rhonchi Cardiovascular  Auscultation:  Regular rate, without rubs, murmurs or gallops  Edema/varicosities:  Not grossly evident Abdominal  Soft,nontender, without masses, guarding or rebound.  Liver/spleen:  No organomegaly noted  Hernia:  None appreciated  Skin  Inspection:  Grossly normal   Breasts: Examined lying and sitting.   Right: Without masses, retractions, discharge or axillary adenopathy.   Left: Without masses, retractions, discharge or axillary adenopathy. Genitourinary   Inguinal/mons:  Normal  without inguinal adenopathy  External genitalia:  Normal appearing vulva with no masses, tenderness, or lesions  BUS/Urethra/Skene's glands:  Normal  Vagina:  Normal appearing with normal color and discharge, no lesions  Cervix:  Normal appearing without discharge or lesions  Uterus:  Normal in size, shape and contour.  Midline and mobile, nontender  Adnexa/parametria:     Rt: Normal in size, without masses or tenderness.   Lt: Normal in size, without masses or tenderness.  Anus and perineum: Normal  Digital rectal exam: Normal sphincter tone without palpated masses or tenderness  Patient informed chaperone available to be present for breast and pelvic exam. Patient has requested no chaperone to be present. Patient has been advised what will be completed during breast and pelvic exam.   Assessment/Plan:  56 y.o. [id number] for annual exam.   Well female exam with routine gynecological exam - Plan: CBC with Differential/Platelet, Comprehensive metabolic panel, TSH. Education provided on SBEs, importance of preventative screenings, current guidelines, high calcium diet, regular exercise, and multivitamin daily. Encouraged to establish with PCP due to family history of heart disease and elevated lipids. She is agreeable. She has cut back on smoking due to workload and cold weather.    Family history of breast cancer in first degree relative - Susan Morse and Susan Morse with history of breast cancer. Declined genetic testing.  Postmenopausal - no HRT, no bleeding  Hyperlipidemia, unspecified hyperlipidemia type - Plan: Lipid panel. Discussed current ASCVD 10-year risk of 5.6% (borderline). Encouraged to work on low fat/low cholesterol diet and regular exercise. She plans to establish with PCP.   Family history of heart disease - father deceased from MI.   Screening for cervical cancer - Normal Pap history.  Will repeat at 5-year  interval per guidelines.  Screening for breast cancer - Normal mammogram  history.  Continue annual screenings.  Normal breast exam today.  Screening for colon cancer - 2017 benign polyp. Will repeat screening colonoscopy at 5-year interval per guidelines.   Follow up in 1 year for annual.         Tamela Gammon [facility], 10:55 AM 04/13/2021

## 2021-04-13 ENCOUNTER — Other Ambulatory Visit: Payer: Self-pay

## 2021-04-13 ENCOUNTER — Encounter: Payer: Self-pay | Admitting: Nurse Practitioner

## 2021-04-13 ENCOUNTER — Ambulatory Visit (INDEPENDENT_AMBULATORY_CARE_PROVIDER_SITE_OTHER): Payer: 59 | Admitting: Nurse Practitioner

## 2021-04-13 VITALS — BP 124/80 | Ht 61.0 in | Wt 203.0 lb

## 2021-04-13 DIAGNOSIS — E785 Hyperlipidemia, unspecified: Secondary | ICD-10-CM | POA: Diagnosis not present

## 2021-04-13 DIAGNOSIS — Z8249 Family history of ischemic heart disease and other diseases of the circulatory system: Secondary | ICD-10-CM

## 2021-04-13 DIAGNOSIS — Z01419 Encounter for gynecological examination (general) (routine) without abnormal findings: Secondary | ICD-10-CM | POA: Diagnosis not present

## 2021-04-13 DIAGNOSIS — Z780 Asymptomatic menopausal state: Secondary | ICD-10-CM | POA: Diagnosis not present

## 2021-04-13 DIAGNOSIS — F419 Anxiety disorder, unspecified: Secondary | ICD-10-CM

## 2021-04-13 LAB — CBC WITH DIFFERENTIAL/PLATELET
Absolute Monocytes: 664 cells/uL (ref 200–950)
Basophils Absolute: 47 cells/uL (ref 0–200)
Basophils Relative: 0.6 %
Eosinophils Absolute: 126 cells/uL (ref 15–500)
Eosinophils Relative: 1.6 %
HCT: 43.6 % (ref 35.0–45.0)
Hemoglobin: 14.6 g/dL (ref 11.7–15.5)
Lymphs Abs: 1959 cells/uL (ref 850–3900)
MCH: 33 pg (ref 27.0–33.0)
MCHC: 33.5 g/dL (ref 32.0–36.0)
MCV: 98.4 fL (ref 80.0–100.0)
MPV: 11.9 fL (ref 7.5–12.5)
Monocytes Relative: 8.4 %
Neutro Abs: 5103 cells/uL (ref 1500–7800)
Neutrophils Relative %: 64.6 %
Platelets: 187 10*3/uL (ref 140–400)
RBC: 4.43 10*6/uL (ref 3.80–5.10)
RDW: 11.8 % (ref 11.0–15.0)
Total Lymphocyte: 24.8 %
WBC: 7.9 10*3/uL (ref 3.8–10.8)

## 2021-04-13 LAB — COMPREHENSIVE METABOLIC PANEL
AG Ratio: 1.9 (calc) (ref 1.0–2.5)
ALT: 20 U/L (ref 6–29)
AST: 16 U/L (ref 10–35)
Albumin: 4.3 g/dL (ref 3.6–5.1)
Alkaline phosphatase (APISO): 63 U/L (ref 37–153)
BUN: 12 mg/dL (ref 7–25)
CO2: 29 mmol/L (ref 20–32)
Calcium: 9.5 mg/dL (ref 8.6–10.4)
Chloride: 100 mmol/L (ref 98–110)
Creat: 0.96 mg/dL (ref 0.50–1.03)
Globulin: 2.3 g/dL (calc) (ref 1.9–3.7)
Glucose, Bld: 86 mg/dL (ref 65–99)
Potassium: 4.6 mmol/L (ref 3.5–5.3)
Sodium: 136 mmol/L (ref 135–146)
Total Bilirubin: 0.6 mg/dL (ref 0.2–1.2)
Total Protein: 6.6 g/dL (ref 6.1–8.1)

## 2021-04-13 LAB — LIPID PANEL
Cholesterol: 218 mg/dL — ABNORMAL HIGH (ref <200)
HDL: 49 mg/dL — ABNORMAL LOW (ref >=50)
LDL Cholesterol (Calc): 134 mg/dL (calc) — ABNORMAL HIGH
Non-HDL Cholesterol (Calc): 169 mg/dL (calc) — ABNORMAL HIGH (ref <130)
Total CHOL/HDL Ratio: 4.4 (calc) (ref <5.0)
Triglycerides: 208 mg/dL — ABNORMAL HIGH (ref <150)

## 2021-04-13 LAB — TSH: TSH: 3.48 mIU/L

## 2021-08-21 ENCOUNTER — Encounter: Payer: 59 | Admitting: Gastroenterology

## 2022-04-03 ENCOUNTER — Encounter: Payer: Self-pay | Admitting: Nurse Practitioner

## 2022-04-09 ENCOUNTER — Ambulatory Visit: Payer: 59 | Admitting: Nurse Practitioner

## 2022-04-17 ENCOUNTER — Ambulatory Visit: Payer: 59 | Admitting: Nurse Practitioner

## 2022-05-29 ENCOUNTER — Encounter: Payer: Self-pay | Admitting: Nurse Practitioner

## 2022-05-29 ENCOUNTER — Ambulatory Visit (INDEPENDENT_AMBULATORY_CARE_PROVIDER_SITE_OTHER): Payer: PRIVATE HEALTH INSURANCE | Admitting: Nurse Practitioner

## 2022-05-29 VITALS — BP 126/80 | Ht 62.0 in | Wt 209.0 lb

## 2022-05-29 DIAGNOSIS — E785 Hyperlipidemia, unspecified: Secondary | ICD-10-CM | POA: Diagnosis not present

## 2022-05-29 DIAGNOSIS — Z780 Asymptomatic menopausal state: Secondary | ICD-10-CM | POA: Diagnosis not present

## 2022-05-29 DIAGNOSIS — Z01419 Encounter for gynecological examination (general) (routine) without abnormal findings: Secondary | ICD-10-CM

## 2022-05-29 NOTE — Progress Notes (Signed)
Susan Morse 23-Oct-1965 [id number]   History:  56 y.o. G2P2002 presents for annual exam without GYN complaints. Postmenopausal - no HRT, no bleeding. Normal pap and mammogram history. Mother (age 9) and sister (age 76) with history of breast cancer. She had genetic counseling but decided to not pursue genetic testing at that time. Hyperlipidemia with recommendations to establish with PCP. Had to cancel due to loss of insurance at the time but plans to reschedule. Smoker. Father deceased from heart attack.   Gynecologic History Patient's last menstrual period was 02/18/2019. Contraception: post menopausal status Sexually active: No  Health maintenance Last Pap: 02/02/2020. Results were: Normal neg HPV, 5-year repeat Last mammogram: 09/15/2020. Results were: Normal Last colonoscopy: 03/12/2016. Results were: Adenoma, 7-year recall Last Dexa: Not indicated  Past medical history, past surgical history, family history and social history were all reviewed and documented in the EPIC chart. Divorced. 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 2 children live locally, 4 grandchildren ages 8-16.   ROS:  A ROS was performed and pertinent positives and negatives are included.  Exam:  Vitals:   05/29/22 0858  BP: 126/80  Weight: 209 lb (94.8 kg)  Height: '5\' 2"'$  (1.575 m)     Body mass index is 38.23 kg/m.  General appearance:  Normal Thyroid:  Symmetrical, normal in size, without palpable masses or nodularity. Respiratory  Auscultation:  Clear without wheezing or rhonchi Cardiovascular  Auscultation:  Regular rate, without rubs, murmurs or gallops  Edema/varicosities:  Not grossly evident Abdominal  Soft,nontender, without masses, guarding or rebound.  Liver/spleen:  No organomegaly noted  Hernia:  None appreciated  Skin  Inspection:  Grossly normal   Breasts: Examined lying and sitting.   Right: Without masses, retractions, discharge or axillary adenopathy.   Left: Without  masses, retractions, discharge or axillary adenopathy. Genitourinary   Inguinal/mons:  Normal without inguinal adenopathy  External genitalia:  Normal appearing vulva with no masses, tenderness, or lesions  BUS/Urethra/Skene's glands:  Normal  Vagina:  Normal appearing with normal color and discharge, no lesions  Cervix:  Normal appearing without discharge or lesions  Uterus:  Normal in size, shape and contour.  Midline and mobile, nontender  Adnexa/parametria:     Rt: Normal in size, without masses or tenderness.   Lt: Normal in size, without masses or tenderness.  Anus and perineum: Normal  Digital rectal exam: Deferred  Patient informed chaperone available to be present for breast and pelvic exam. Patient has requested no chaperone to be present. Patient has been advised what will be completed during breast and pelvic exam.   Assessment/Plan:  57 y.o. [id number] for annual exam.   Well female exam with routine gynecological exam - Plan: CBC with Differential/Platelet, Comprehensive metabolic panel. Education provided on SBEs, importance of preventative screenings, current guidelines, high calcium diet, regular exercise, and multivitamin daily. Encouraged to establish with PCP due to family history of heart disease and elevated lipids. She is agreeable. Had to cancel due to loss of insurance but plans to reschedule.   Postmenopausal - no HRT, no bleeding  Hyperlipidemia, unspecified hyperlipidemia type - Plan: Lipid panel. Encouraged to work on low fat/low cholesterol diet and regular exercise. Family history of heart disease, smoker.She plans to establish with PCP for management.   Screening for cervical cancer - Normal Pap history.  Will repeat at 5-year interval per guidelines.  Screening for breast cancer - Normal mammogram history. Overdue and plans to schedule soon. Mother and sister with history of breast cancer. Declines  genetic testing. Normal breast exam today.  Screening for  colon cancer - 2017 benign polyp. Will repeat screening colonoscopy at 7-year interval per guidelines.   Follow up in 1 year for annual.         Tamela Gammon [facility], 9:19 AM 05/29/2022

## 2022-05-30 LAB — CBC WITH DIFFERENTIAL/PLATELET
Absolute Monocytes: 590 cells/uL (ref 200–950)
Basophils Absolute: 43 cells/uL (ref 0–200)
Basophils Relative: 0.6 %
Eosinophils Absolute: 101 cells/uL (ref 15–500)
Eosinophils Relative: 1.4 %
HCT: 45.4 % — ABNORMAL HIGH (ref 35.0–45.0)
Hemoglobin: 15.5 g/dL (ref 11.7–15.5)
Lymphs Abs: 1548 cells/uL (ref 850–3900)
MCH: 32.8 pg (ref 27.0–33.0)
MCHC: 34.1 g/dL (ref 32.0–36.0)
MCV: 96 fL (ref 80.0–100.0)
MPV: 11.8 fL (ref 7.5–12.5)
Monocytes Relative: 8.2 %
Neutro Abs: 4918 cells/uL (ref 1500–7800)
Neutrophils Relative %: 68.3 %
Platelets: 204 10*3/uL (ref 140–400)
RBC: 4.73 10*6/uL (ref 3.80–5.10)
RDW: 11.9 % (ref 11.0–15.0)
Total Lymphocyte: 21.5 %
WBC: 7.2 10*3/uL (ref 3.8–10.8)

## 2022-05-30 LAB — LIPID PANEL
Cholesterol: 241 mg/dL — ABNORMAL HIGH (ref <200)
HDL: 58 mg/dL (ref >=50)
LDL Cholesterol (Calc): 156 mg/dL (calc) — ABNORMAL HIGH
Non-HDL Cholesterol (Calc): 183 mg/dL (calc) — ABNORMAL HIGH (ref <130)
Total CHOL/HDL Ratio: 4.2 (calc) (ref <5.0)
Triglycerides: 142 mg/dL (ref <150)

## 2022-05-30 LAB — COMPREHENSIVE METABOLIC PANEL
AG Ratio: 2 (calc) (ref 1.0–2.5)
ALT: 25 U/L (ref 6–29)
AST: 16 U/L (ref 10–35)
Albumin: 4.5 g/dL (ref 3.6–5.1)
Alkaline phosphatase (APISO): 61 U/L (ref 37–153)
BUN: 12 mg/dL (ref 7–25)
CO2: 27 mmol/L (ref 20–32)
Calcium: 9.9 mg/dL (ref 8.6–10.4)
Chloride: 104 mmol/L (ref 98–110)
Creat: 1.03 mg/dL (ref 0.50–1.03)
Globulin: 2.3 g/dL (calc) (ref 1.9–3.7)
Glucose, Bld: 92 mg/dL (ref 65–99)
Potassium: 5 mmol/L (ref 3.5–5.3)
Sodium: 140 mmol/L (ref 135–146)
Total Bilirubin: 0.5 mg/dL (ref 0.2–1.2)
Total Protein: 6.8 g/dL (ref 6.1–8.1)

## 2022-11-20 ENCOUNTER — Other Ambulatory Visit: Payer: Self-pay | Admitting: Nurse Practitioner

## 2022-11-20 DIAGNOSIS — Z1231 Encounter for screening mammogram for malignant neoplasm of breast: Secondary | ICD-10-CM

## 2022-12-06 ENCOUNTER — Ambulatory Visit
Admission: RE | Admit: 2022-12-06 | Discharge: 2022-12-06 | Disposition: A | Discharge status: Home / Self Care | Payer: PRIVATE HEALTH INSURANCE | Source: Ambulatory Visit | Attending: Nurse Practitioner | Admitting: Nurse Practitioner

## 2022-12-06 DIAGNOSIS — Z1231 Encounter for screening mammogram for malignant neoplasm of breast: Secondary | ICD-10-CM

## 2023-05-28 IMAGING — MG MM DIGITAL SCREENING BILAT W/ TOMO AND CAD
8 series · 8 of 24 positions shown · non-contrast
Comparison: Previous exam(s).

CLINICAL DATA: Screening.

EXAM:
DIGITAL SCREENING BILATERAL MAMMOGRAM WITH TOMOSYNTHESIS AND CAD
TECHNIQUE: Bilateral screening digital craniocaudal and mediolateral oblique
mammograms were obtained. Bilateral screening digital breast
tomosynthesis was performed. The images were evaluated with
computer-aided detection.

[L MLO synth-2D]
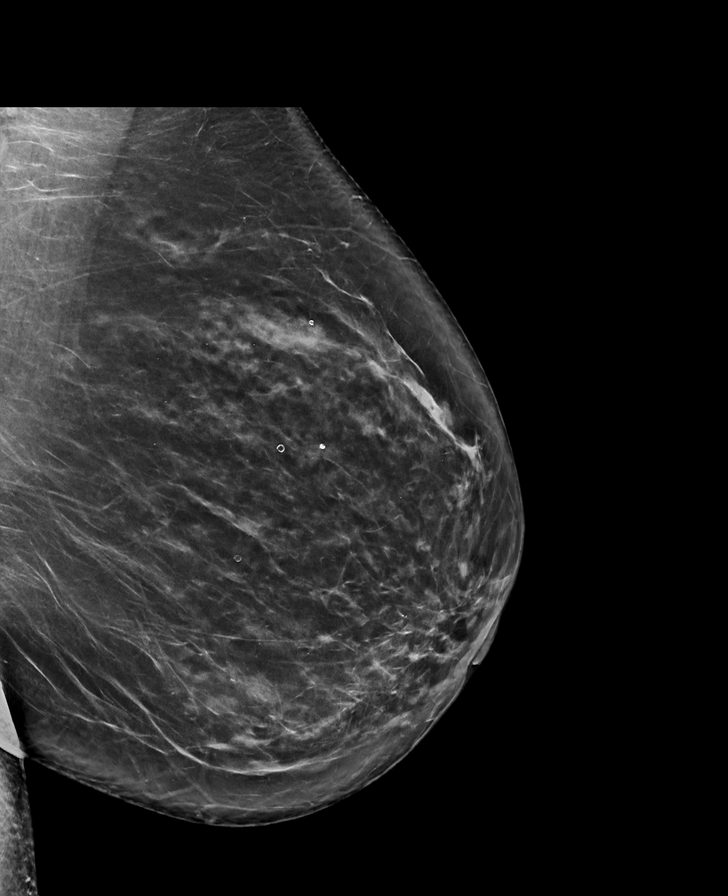

[R CC synth-2D]
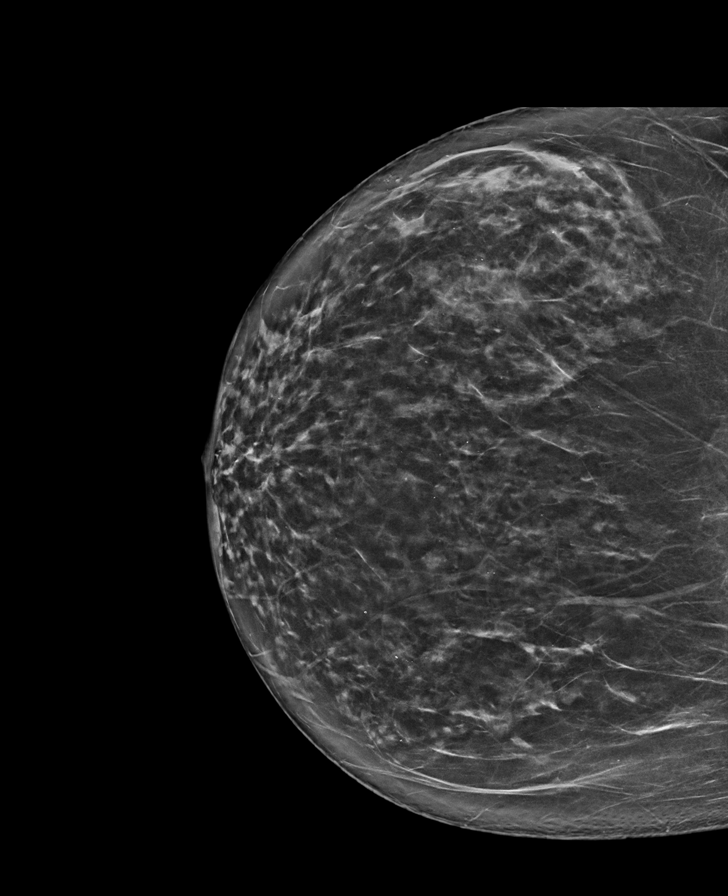

[R MLO synth-2D]
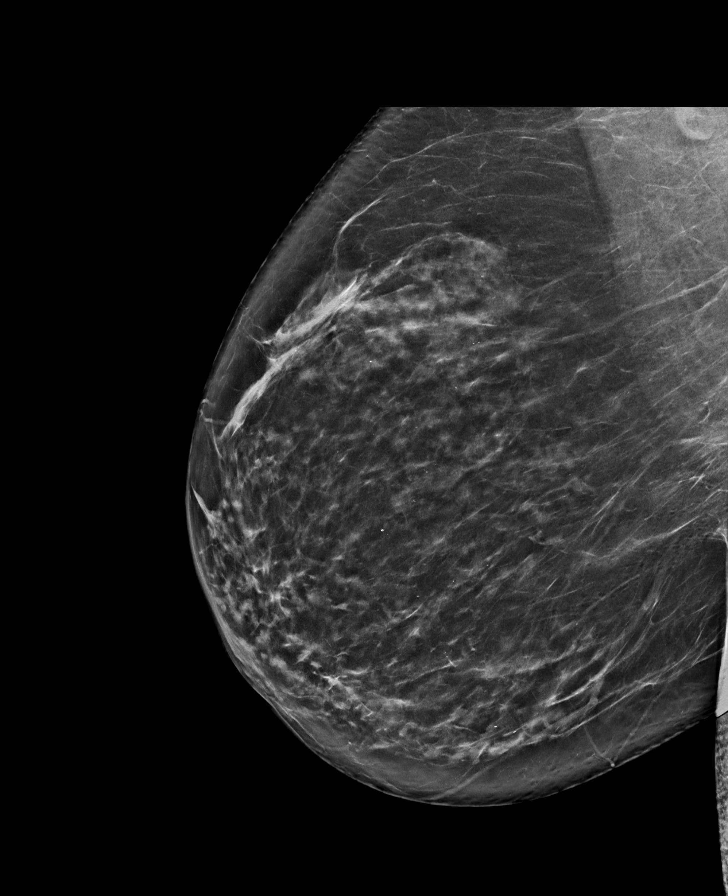

[L CC synth-2D]
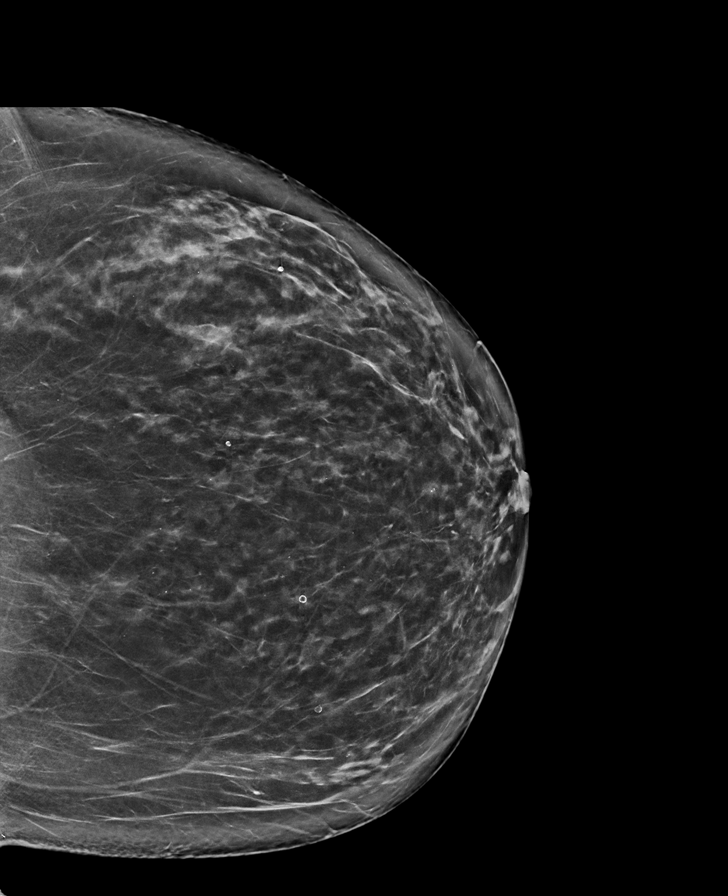

[R CC tomo · tomo slice 39/76.0]
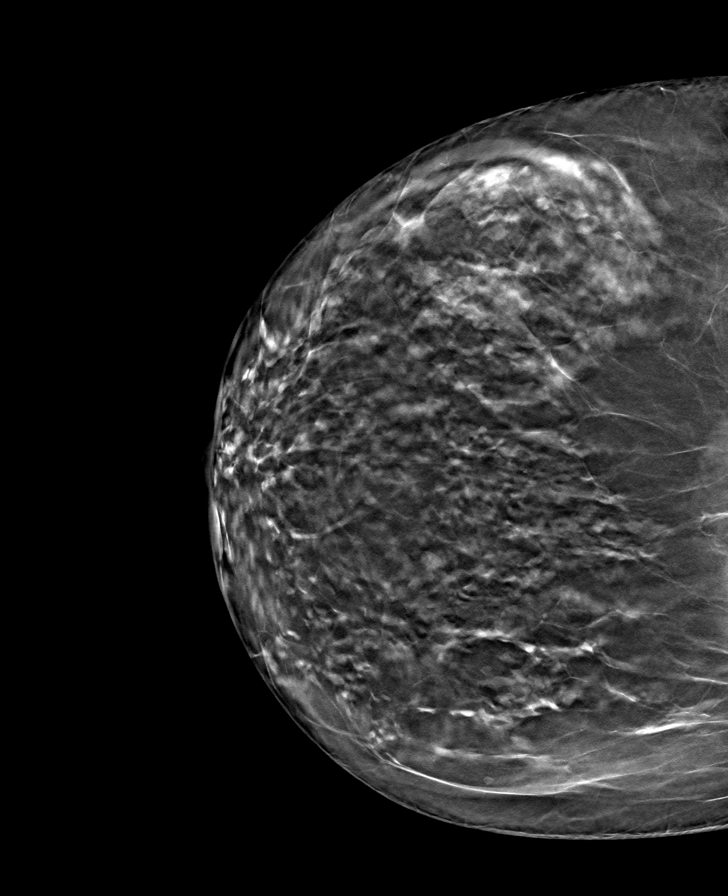

[L CC tomo · tomo slice 41/81.0]
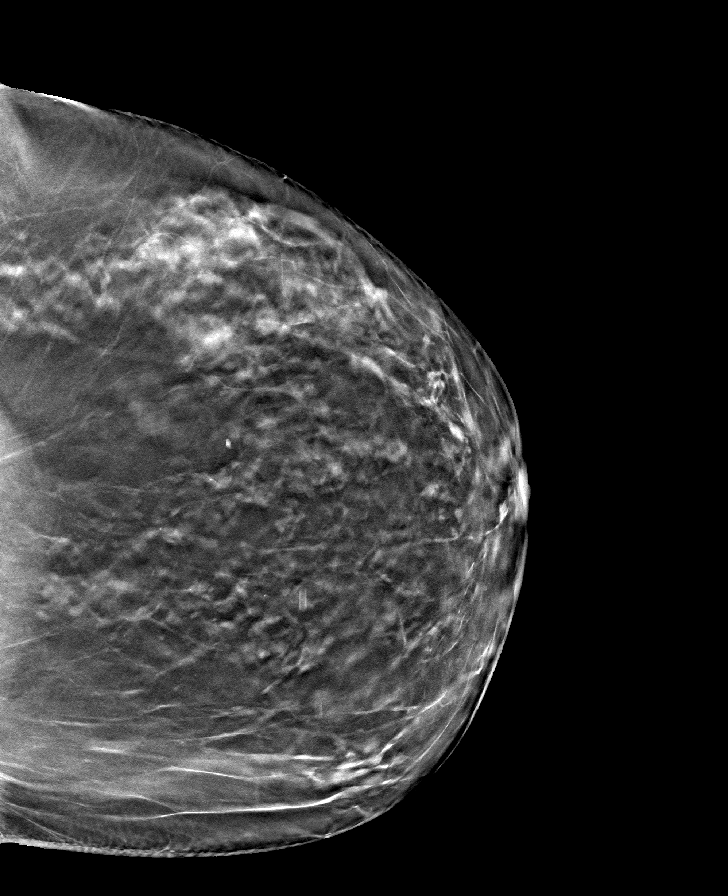

[L MLO tomo · tomo slice 46/91.0]
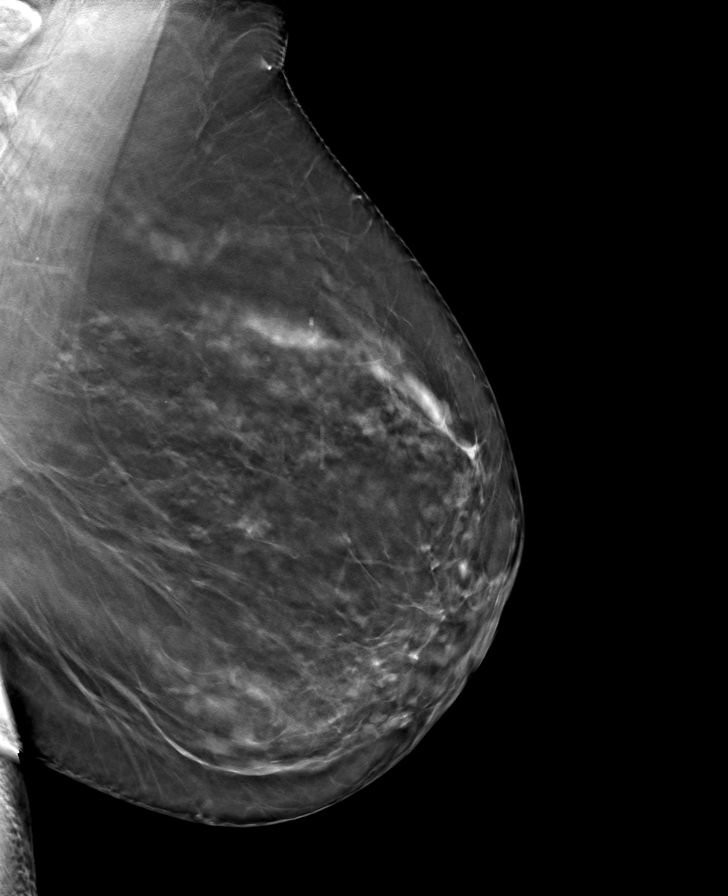

[R MLO tomo · tomo slice 44/87.0]
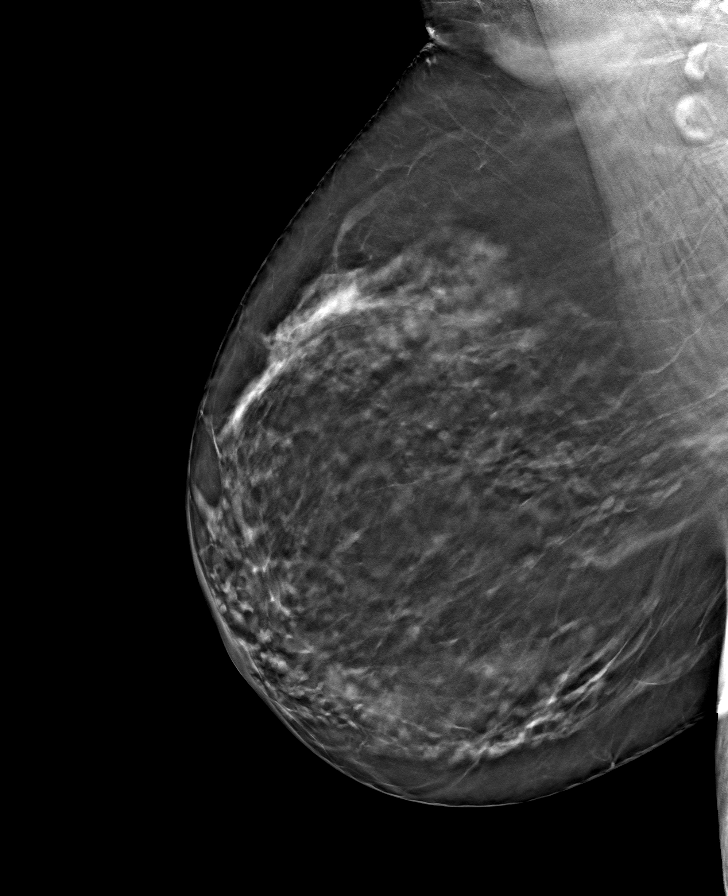

[8 of 24 positions shown; findings below may reference images not displayed]

ACR Breast Density Category b: There are scattered areas of
fibroglandular density.
FINDINGS: There are no findings suspicious for malignancy.
IMPRESSION: No mammographic evidence of malignancy. A result letter of this
screening mammogram will be mailed directly to the patient.

RECOMMENDATION:
Screening mammogram in one year. (Code:51-O-LD2)

BI-RADS CATEGORY  1: Negative.
